# Patient Record
Sex: Female | Born: 1955 | Race: Black or African American | Hispanic: No | Smoking: Never smoker
Health system: Southern US, Community
[De-identification: ages and names within clinical notes are randomized; demographics above are authoritative.]

## PROBLEM LIST (undated history)

## (undated) DIAGNOSIS — I10 Essential (primary) hypertension: Secondary | ICD-10-CM

## (undated) DIAGNOSIS — N939 Abnormal uterine and vaginal bleeding, unspecified: Secondary | ICD-10-CM

## (undated) DIAGNOSIS — E559 Vitamin D deficiency, unspecified: Secondary | ICD-10-CM

## (undated) DIAGNOSIS — M1990 Unspecified osteoarthritis, unspecified site: Secondary | ICD-10-CM

## (undated) DIAGNOSIS — Q613 Polycystic kidney, unspecified: Secondary | ICD-10-CM

## (undated) DIAGNOSIS — K219 Gastro-esophageal reflux disease without esophagitis: Secondary | ICD-10-CM

## (undated) DIAGNOSIS — R7303 Prediabetes: Secondary | ICD-10-CM

## (undated) DIAGNOSIS — E119 Type 2 diabetes mellitus without complications: Secondary | ICD-10-CM

## (undated) DIAGNOSIS — D649 Anemia, unspecified: Secondary | ICD-10-CM

## (undated) HISTORY — PX: TUBAL LIGATION: SHX77

## (undated) HISTORY — PX: BREAST SURGERY: SHX581

## (undated) HISTORY — PX: TONSILLECTOMY: SUR1361

## (undated) HISTORY — PX: COLONOSCOPY: SHX5424

## (undated) HISTORY — PX: HIP SURGERY: SHX245

---

## 1999-08-01 HISTORY — PX: BREAST EXCISIONAL BIOPSY: SUR124

## 2004-08-03 ENCOUNTER — Ambulatory Visit: Payer: Self-pay | Admitting: Unknown Physician Specialty

## 2005-08-09 ENCOUNTER — Ambulatory Visit: Payer: Self-pay | Admitting: Unknown Physician Specialty

## 2006-05-31 ENCOUNTER — Ambulatory Visit: Payer: Self-pay | Admitting: Urology

## 2006-06-03 ENCOUNTER — Emergency Department: Payer: Self-pay | Admitting: Emergency Medicine

## 2006-09-04 ENCOUNTER — Ambulatory Visit: Payer: Self-pay | Admitting: Unknown Physician Specialty

## 2007-09-10 ENCOUNTER — Ambulatory Visit: Payer: Self-pay | Admitting: Unknown Physician Specialty

## 2008-09-15 ENCOUNTER — Ambulatory Visit: Payer: Self-pay | Admitting: Unknown Physician Specialty

## 2008-10-12 ENCOUNTER — Ambulatory Visit: Payer: Self-pay | Admitting: Urology

## 2008-12-21 ENCOUNTER — Ambulatory Visit: Payer: Self-pay | Admitting: Urology

## 2008-12-29 ENCOUNTER — Ambulatory Visit: Payer: Self-pay | Admitting: Urology

## 2009-01-25 ENCOUNTER — Ambulatory Visit: Payer: Self-pay | Admitting: Urology

## 2009-11-04 ENCOUNTER — Ambulatory Visit: Payer: Self-pay | Admitting: Unknown Physician Specialty

## 2011-02-21 ENCOUNTER — Ambulatory Visit: Payer: Self-pay | Admitting: Unknown Physician Specialty

## 2011-12-27 ENCOUNTER — Ambulatory Visit: Payer: Self-pay | Admitting: Urology

## 2012-02-23 ENCOUNTER — Ambulatory Visit: Payer: Self-pay | Admitting: Unknown Physician Specialty

## 2013-03-18 ENCOUNTER — Emergency Department: Payer: Self-pay | Admitting: Emergency Medicine

## 2013-03-18 LAB — CBC
HCT: 35.9 % (ref 35.0–47.0)
HGB: 12.2 g/dL (ref 12.0–16.0)
MCH: 28.5 pg (ref 26.0–34.0)
MCHC: 34 g/dL (ref 32.0–36.0)
MCV: 84 fL (ref 80–100)
Platelet: 204 10*3/uL (ref 150–440)
RBC: 4.28 10*6/uL (ref 3.80–5.20)
RDW: 13.5 % (ref 11.5–14.5)
WBC: 10.7 10*3/uL (ref 3.6–11.0)

## 2013-03-18 LAB — URINALYSIS, COMPLETE
Bacteria: NONE SEEN
Bilirubin,UR: NEGATIVE
Blood: NEGATIVE
Glucose,UR: NEGATIVE mg/dL (ref 0–75)
Hyaline Cast: 3
Ketone: NEGATIVE
Leukocyte Esterase: NEGATIVE
Nitrite: NEGATIVE
Ph: 6 (ref 4.5–8.0)
Protein: NEGATIVE
RBC,UR: <1 /HPF (ref 0–5)
Specific Gravity: 1.009 (ref 1.003–1.030)
Squamous Epithelial: <1
WBC UR: 2 /HPF (ref 0–5)

## 2013-03-18 LAB — COMPREHENSIVE METABOLIC PANEL
Albumin: 4.1 g/dL (ref 3.4–5.0)
Alkaline Phosphatase: 114 U/L (ref 50–136)
Anion Gap: 6 — ABNORMAL LOW (ref 7–16)
BUN: 19 mg/dL — ABNORMAL HIGH (ref 7–18)
Bilirubin,Total: 0.7 mg/dL (ref 0.2–1.0)
Calcium, Total: 9.8 mg/dL (ref 8.5–10.1)
Chloride: 98 mmol/L (ref 98–107)
Co2: 33 mmol/L — ABNORMAL HIGH (ref 21–32)
Creatinine: 1.16 mg/dL (ref 0.60–1.30)
EGFR (African American): >60
EGFR (Non-African Amer.): 53 — ABNORMAL LOW
Glucose: 151 mg/dL — ABNORMAL HIGH (ref 65–99)
Osmolality: 279 (ref 275–301)
Potassium: 2.8 mmol/L — ABNORMAL LOW (ref 3.5–5.1)
SGOT(AST): 25 U/L (ref 15–37)
SGPT (ALT): 26 U/L (ref 12–78)
Sodium: 137 mmol/L (ref 136–145)
Total Protein: 7.6 g/dL (ref 6.4–8.2)

## 2013-03-18 LAB — TROPONIN I: Troponin-I: <0.02 ng/mL

## 2014-06-09 ENCOUNTER — Ambulatory Visit: Payer: Self-pay | Admitting: Family Medicine

## 2014-07-31 HISTORY — PX: JOINT REPLACEMENT: SHX530

## 2014-08-05 ENCOUNTER — Ambulatory Visit: Payer: Self-pay | Admitting: Orthopedic Surgery

## 2014-08-05 DIAGNOSIS — I10 Essential (primary) hypertension: Secondary | ICD-10-CM

## 2014-08-05 DIAGNOSIS — Z01810 Encounter for preprocedural cardiovascular examination: Secondary | ICD-10-CM

## 2014-08-05 LAB — BASIC METABOLIC PANEL
Anion Gap: 7 (ref 7–16)
BUN: 11 mg/dL (ref 7–18)
Calcium, Total: 9.2 mg/dL (ref 8.5–10.1)
Chloride: 105 mmol/L (ref 98–107)
Co2: 30 mmol/L (ref 21–32)
Creatinine: 0.9 mg/dL (ref 0.60–1.30)
EGFR (African American): >60
EGFR (Non-African Amer.): >60
Glucose: 92 mg/dL (ref 65–99)
Osmolality: 282 (ref 275–301)
Potassium: 3.2 mmol/L — ABNORMAL LOW (ref 3.5–5.1)
Sodium: 142 mmol/L (ref 136–145)

## 2014-08-05 LAB — CBC
HCT: 37.8 % (ref 35.0–47.0)
HGB: 12.3 g/dL (ref 12.0–16.0)
MCH: 28.3 pg (ref 26.0–34.0)
MCHC: 32.5 g/dL (ref 32.0–36.0)
MCV: 87 fL (ref 80–100)
Platelet: 194 10*3/uL (ref 150–440)
RBC: 4.33 10*6/uL (ref 3.80–5.20)
RDW: 13.3 % (ref 11.5–14.5)
WBC: 7.1 10*3/uL (ref 3.6–11.0)

## 2014-08-05 LAB — MRSA PCR SCREENING

## 2014-08-05 LAB — PROTIME-INR
INR: 1
Prothrombin Time: 13.3 secs (ref 11.5–14.7)

## 2014-08-05 LAB — URINALYSIS, COMPLETE
Bacteria: NONE SEEN
Bilirubin,UR: NEGATIVE
Blood: NEGATIVE
Glucose,UR: NEGATIVE mg/dL (ref 0–75)
Ketone: NEGATIVE
Leukocyte Esterase: NEGATIVE
Nitrite: NEGATIVE
Ph: 7 (ref 4.5–8.0)
Protein: NEGATIVE
RBC,UR: <1 /HPF (ref 0–5)
Specific Gravity: 1.01 (ref 1.003–1.030)
Squamous Epithelial: NONE SEEN
WBC UR: <1 /HPF (ref 0–5)

## 2014-08-05 LAB — SEDIMENTATION RATE: Erythrocyte Sed Rate: 15 mm/hr (ref 0–30)

## 2014-08-05 LAB — APTT: Activated PTT: 26.7 secs (ref 23.6–35.9)

## 2014-08-11 ENCOUNTER — Inpatient Hospital Stay: Payer: Self-pay | Admitting: Orthopedic Surgery

## 2014-08-12 LAB — BASIC METABOLIC PANEL
Anion Gap: 6 — ABNORMAL LOW (ref 7–16)
BUN: 9 mg/dL (ref 7–18)
Calcium, Total: 8 mg/dL — ABNORMAL LOW (ref 8.5–10.1)
Chloride: 105 mmol/L (ref 98–107)
Co2: 29 mmol/L (ref 21–32)
Creatinine: 0.93 mg/dL (ref 0.60–1.30)
EGFR (African American): >60
EGFR (Non-African Amer.): >60
Glucose: 101 mg/dL — ABNORMAL HIGH (ref 65–99)
Osmolality: 278 (ref 275–301)
Potassium: 3.3 mmol/L — ABNORMAL LOW (ref 3.5–5.1)
Sodium: 140 mmol/L (ref 136–145)

## 2014-08-12 LAB — PLATELET COUNT: Platelet: 169 10*3/uL (ref 150–440)

## 2014-08-12 LAB — HEMOGLOBIN: HGB: 9.8 g/dL — ABNORMAL LOW (ref 12.0–16.0)

## 2014-08-13 LAB — POTASSIUM: Potassium: 3.3 mmol/L — ABNORMAL LOW (ref 3.5–5.1)

## 2014-08-13 LAB — HEMOGLOBIN: HGB: 9.4 g/dL — ABNORMAL LOW (ref 12.0–16.0)

## 2014-08-14 LAB — POTASSIUM: Potassium: 3.5 mmol/L (ref 3.5–5.1)

## 2014-11-23 LAB — SURGICAL PATHOLOGY

## 2014-11-29 NOTE — Op Note (Signed)
PATIENT NAME:  Lori Nguyen, DOMBROSKY MR#:  [id number] DATE OF BIRTH:  [birth year]  DATE OF PROCEDURE:  08/11/2014  PREOPERATIVE DIAGNOSIS: Severe right hip primary osteoarthritis.   POSTOPERATIVE DIAGNOSIS: Severe right hip primary osteoarthritis.   PROCEDURE:  Right total hip replacement anterior approach.   ANESTHESIA: Spinal.   SURGEON: Laurene Footman, MD   DESCRIPTION OF PROCEDURE: The patient was brought to the operating room, and after adequate anesthesia was obtained, the patient was placed on the operative table with the Medacta attachment, left leg on a well-padded table, the right foot in the Chidester boots. C-arm was brought in and good visualization of the hip could be obtained with a slight traction view as initial template. After the hip was prepped and draped in the usual sterile fashion, appropriate patient identification and timeout procedure were completed.   An anterior approach was made, centered over the greater trochanter. Incision carried down through the skin and subcutaneous tissue. The tensor fascia muscle  was retracted laterally after incising its outer fascia. The deep fascia incised and the lateral femoral vessels were quite small in this patient and cauterized. The anterior capsule was then exposed, then a capsulotomy performed exposing the neck. Retractors were placed around the neck and the neck cut was then made. The head was removed and it was quite deformed and small. The labrum was excised. The acetabulum was noted to have sclerotic bone. After reaming initially with a 46, a 48 mm reaming gave good bleeding bone and a good position to the cup. Trial was placed and the 48 cup fit well, 48 cup was inserted and impacted in the appropriate anteversion and lateral opening with C-arm confirming this.   Next, the leg was externally rotated and a pubofemoral and a limited ischiofemoral release were carried out.  The leg was dropped into extension and the proximal femur exposed.  It was opened with a box osteotome and sequentially broached up to a size 2. The size 2 gave very good fill and with a small head trial gave appropriate leg length. These components were chosen and the 2 stem was impacted followed by the S 28 mm head and liner for the 48 mm Versafitcup dual mobility liner. These were assembled on the back table, impacted on the trunnion and the hip reduced after irrigating the acetabulum free of any debris. The hip was very stable to range of motion and leg lengths appeared equal based on comparison to preop template x-ray with restoration of normal anatomy.   The wound was thoroughly irrigated and closed with a heavy Quill suture for the fascia, a subcutaneous drain was placed, 2-0 Quill subcutaneously and skin staples. Xeroform, 4 x 4's, ABD, tape applied and the patient was sent to the recovery room in stable condition.   ESTIMATED BLOOD LOSS: 400 mL.   COMPLICATIONS: None.   SPECIMEN: Removed femoral head.   IMPLANTS: Medacta AMIS size 2 standard with a 48 mm Versafitcup DM and liner S 28 mm head.   ____________________________ Laurene Footman, MD mjm:AT D: 08/11/2014 20:41:46 ET T: 08/12/2014 03:39:40 ET JOB#: [id number]  cc: Laurene Footman, MD, <Dictator> Laurene Footman MD ELECTRONICALLY SIGNED 08/12/2014 8:41

## 2014-11-29 NOTE — Discharge Summary (Signed)
PATIENT NAME:  Lori Nguyen, Lori Nguyen MR#:  [id number] DATE OF BIRTH:  January 18, 1956  DATE OF ADMISSION:  08/11/2014 DATE OF DISCHARGE:  08/14/2014    ADMITTING DIAGNOSIS: Right hip severe osteoarthritis.   DISCHARGE DIAGNOSIS: Right hip severe osteoarthritis.   PROCEDURE: Right total hip replacement, anterior approach,   ANESTHESIA: Spinal.   SURGEON: Hessie Knows, MD   ESTIMATED BLOOD LOSS: 400 mL.   COMPLICATIONS: None.   SPECIMEN: Removed femoral head.   IMPLANTS: Medacta Amis size 2 standard with a 48 mm Versafit cup DM with liner and 28 mm head.   HISTORY OF PRESENT ILLNESS: The patient has had severe right hip pain for greater than 1 year. Pain is interfering with activities of daily living. The patient has difficulty with sitting in a chair for longer than 1 hour as well as transferring from sitting to standing. She uses a cane full time. She has failed all nonoperative treatment options.   PHYSICAL EXAMINATION:  GENERAL: Well-developed, well-nourished, in no apparent distress.  EYES: Pupils equal, round, and reactive to light.  CARDIOVASCULAR: Regular rate and rhythm.  RESPIRATORY: Clear breath sounds. No wheezing, rales, rhonchi.  VASCULAR: No edema.  MUSCULOSKELETAL: Normal except as noted in history of present illness.  RIGHT HIP: Examination shows the patient has hip flexion to 90 degrees, hip extension to -10 degrees, abduction to 10 degrees and adduction to 5 degrees, internal rotation, external rotation is 0. She is neurovascularly intact in the right lower extremity. She has severe pain with hip internal rotation. She is neurovascularly intact in the right lower extremity.   HOSPITAL COURSE: The patient was admitted to the hospital on 08/11/2014. She had surgery that same day and was brought to the orthopedic floor from the PACU in stable condition. On postoperative day 1, the patient had acute postoperative blood loss anemia with a hemoglobin of 9.8.  She was hypokalemic at  3.3. She was supplemented with potassium. She had slow progress with physical therapy on the first day.  On postoperative day 2, the patient's potassium was stable at 3.3, hemoglobin was stable at 9.4. Vital signs are stable. She is doing well and made significant progress while ambulating 120 feet with physical therapy.  By postoperative day 3, hemoglobin was stable and potassium was up to 3.5.  Vital signs were stable and she was stable and ready for discharge to rehab facility for continuation of physical therapy.   CONDITION AT DISCHARGE: Stable.   DISCHARGE INSTRUCTIONS: The patient may gradually increase weight-bearing on the affected extremity.  Elevate the affected foot or leg on 1 to 2 pillows with the foot higher than the knee. Thigh-high TED hose on both legs, remove 1 hour per 8 hour shift. Elevate the heels off the bed. Incentive spirometer every hour while awake. Encourage cough and deep breathing. The patient may resume a regular diet as tolerated. Apply an ice pack to the affected area. Do not get the dressing or bandage wet or dirty. Call [facility] orthopedics if the dressing gets water under it. Leave the dressing on. Call [facility] orthopedics if any of the following occur: Bright red bleeding from the incision wound, fever above 101.5 degrees, redness, swelling, or drainage at the incision site. Call [facility] orthopedics if you experience any increased leg pain, numbness or weakness in your legs or bowel or bladder symptoms.  Call [facility] orthopedics for follow-up appointment in 2 weeks.     ____________________________ T. Rachelle Hora, PA-C tcg:DT D: 08/14/2014 07:53:39  ET T: 08/14/2014 08:19:16 ET JOB#: [id number]  cc: T. Rachelle Hora, PA-C, <Dictator> Duanne Guess [place] ELECTRONICALLY SIGNED 08/19/2014 8:26

## 2015-04-27 ENCOUNTER — Other Ambulatory Visit: Payer: Self-pay | Admitting: *Deleted

## 2015-04-27 DIAGNOSIS — Z1231 Encounter for screening mammogram for malignant neoplasm of breast: Secondary | ICD-10-CM

## 2015-05-01 ENCOUNTER — Encounter: Payer: Self-pay | Admitting: Emergency Medicine

## 2015-05-01 ENCOUNTER — Emergency Department
Admission: EM | Admit: 2015-05-01 | Discharge: 2015-05-01 | Disposition: A | Discharge status: Home / Self Care | Payer: 59 | Attending: Emergency Medicine | Admitting: Emergency Medicine

## 2015-05-01 DIAGNOSIS — E119 Type 2 diabetes mellitus without complications: Secondary | ICD-10-CM | POA: Diagnosis not present

## 2015-05-01 DIAGNOSIS — M549 Dorsalgia, unspecified: Secondary | ICD-10-CM | POA: Insufficient documentation

## 2015-05-01 DIAGNOSIS — E876 Hypokalemia: Secondary | ICD-10-CM

## 2015-05-01 DIAGNOSIS — R112 Nausea with vomiting, unspecified: Secondary | ICD-10-CM | POA: Diagnosis present

## 2015-05-01 DIAGNOSIS — Z91040 Latex allergy status: Secondary | ICD-10-CM | POA: Diagnosis not present

## 2015-05-01 DIAGNOSIS — I10 Essential (primary) hypertension: Secondary | ICD-10-CM | POA: Insufficient documentation

## 2015-05-01 DIAGNOSIS — Z8639 Personal history of other endocrine, nutritional and metabolic disease: Secondary | ICD-10-CM

## 2015-05-01 HISTORY — DX: Essential (primary) hypertension: I10

## 2015-05-01 HISTORY — DX: Polycystic kidney, unspecified: Q61.3

## 2015-05-01 HISTORY — DX: Type 2 diabetes mellitus without complications: E11.9

## 2015-05-01 LAB — URINALYSIS COMPLETE WITH MICROSCOPIC (ARMC ONLY)
Bacteria, UA: NONE SEEN
Bilirubin Urine: NEGATIVE
Glucose, UA: NEGATIVE mg/dL
Ketones, ur: NEGATIVE mg/dL
Leukocytes, UA: NEGATIVE
Nitrite: NEGATIVE
Protein, ur: 30 mg/dL — AB
Specific Gravity, Urine: 1.01 (ref 1.005–1.030)
Squamous Epithelial / LPF: NONE SEEN
pH: 6 (ref 5.0–8.0)

## 2015-05-01 LAB — CBC WITH DIFFERENTIAL/PLATELET
Basophils Absolute: 0.1 10*3/uL (ref 0–0.1)
Basophils Relative: 1 %
Eosinophils Absolute: 0 10*3/uL (ref 0–0.7)
Eosinophils Relative: 0 %
HCT: 33.8 % — ABNORMAL LOW (ref 35.0–47.0)
Hemoglobin: 11.4 g/dL — ABNORMAL LOW (ref 12.0–16.0)
Lymphocytes Relative: 9 %
Lymphs Abs: 1.2 10*3/uL (ref 1.0–3.6)
MCH: 28.3 pg (ref 26.0–34.0)
MCHC: 33.6 g/dL (ref 32.0–36.0)
MCV: 84.3 fL (ref 80.0–100.0)
Monocytes Absolute: 1.1 10*3/uL — ABNORMAL HIGH (ref 0.2–0.9)
Monocytes Relative: 9 %
Neutro Abs: 10.9 10*3/uL — ABNORMAL HIGH (ref 1.4–6.5)
Neutrophils Relative %: 81 %
Platelets: 196 10*3/uL (ref 150–440)
RBC: 4.01 MIL/uL (ref 3.80–5.20)
RDW: 13.2 % (ref 11.5–14.5)
WBC: 13.4 10*3/uL — ABNORMAL HIGH (ref 3.6–11.0)

## 2015-05-01 LAB — COMPREHENSIVE METABOLIC PANEL
ALT: 15 U/L (ref 14–54)
AST: 27 U/L (ref 15–41)
Albumin: 3.9 g/dL (ref 3.5–5.0)
Alkaline Phosphatase: 82 U/L (ref 38–126)
Anion gap: 10 (ref 5–15)
BUN: 10 mg/dL (ref 6–20)
CO2: 28 mmol/L (ref 22–32)
Calcium: 8.8 mg/dL — ABNORMAL LOW (ref 8.9–10.3)
Chloride: 99 mmol/L — ABNORMAL LOW (ref 101–111)
Creatinine, Ser: 0.87 mg/dL (ref 0.44–1.00)
GFR calc Af Amer: >60 mL/min (ref >60)
GFR calc non Af Amer: >60 mL/min (ref >60)
Glucose, Bld: 195 mg/dL — ABNORMAL HIGH (ref 65–99)
Potassium: 2.9 mmol/L — CL (ref 3.5–5.1)
Sodium: 137 mmol/L (ref 135–145)
Total Bilirubin: 0.9 mg/dL (ref 0.3–1.2)
Total Protein: 6.9 g/dL (ref 6.5–8.1)

## 2015-05-01 LAB — LIPASE, BLOOD: Lipase: 17 U/L — ABNORMAL LOW (ref 22–51)

## 2015-05-01 MED ORDER — KETOROLAC TROMETHAMINE 30 MG/ML IJ SOLN
30.0000 mg | Freq: Once | INTRAMUSCULAR | Status: AC
  Administered 2015-05-01: 30 mg via INTRAVENOUS
  Filled 2015-05-01: qty 1

## 2015-05-01 MED ORDER — HYDROCODONE-ACETAMINOPHEN 5-325 MG PO TABS: 1.0000 | ORAL_TABLET | ORAL | Status: DC | PRN

## 2015-05-01 MED ORDER — POTASSIUM CHLORIDE ER 10 MEQ PO TBCR: 10.0000 meq | EXTENDED_RELEASE_TABLET | Freq: Every day | ORAL | Status: DC

## 2015-05-01 MED ORDER — ONDANSETRON HCL 4 MG/2ML IJ SOLN
INTRAMUSCULAR | Status: AC
  Filled 2015-05-01: qty 2

## 2015-05-01 MED ORDER — POTASSIUM CHLORIDE CRYS ER 20 MEQ PO TBCR
40.0000 meq | EXTENDED_RELEASE_TABLET | Freq: Once | ORAL | Status: AC
  Administered 2015-05-01: 40 meq via ORAL
  Filled 2015-05-01: qty 2

## 2015-05-01 MED ORDER — ONDANSETRON 4 MG PO TBDP: 4.0000 mg | ORAL_TABLET | Freq: Three times a day (TID) | ORAL | Status: DC | PRN

## 2015-05-01 MED ORDER — ONDANSETRON HCL 4 MG/2ML IJ SOLN
4.0000 mg | Freq: Once | INTRAMUSCULAR | Status: AC
  Administered 2015-05-01: 4 mg via INTRAVENOUS

## 2015-05-01 NOTE — ED Notes (Signed)
Nausea and vomiting since yesterday.

## 2015-05-01 NOTE — ED Provider Notes (Signed)
[facility] Emergency Department Provider Note  ____________________________________________  Time seen: Approximately 1:06 PM  I have reviewed the triage vital signs and the nursing notes.   HISTORY  Chief Complaint Emesis   HPI Lori Nguyen is a 59 y.o. female is here complaining of vomiting since yesterday. Patient states that she lost count of how many times she vomited yesterday. She is only vomited twice today. She denies any history of diarrhea. She denies any history of fever, chills or dizziness. Zofran was given to the patient in triage and she states that her nausea is improved. Patient has had history of hypokalemia and currently is taking an over-the-counter supplement. She is unaware of how low her potassium is actually been in the past. She denies any symptoms of chest pain or shortness of breath. Patient states she has been ambulatory at home without any difficulty. Currently she rates her pain as an 8 out of 10. She also complains of right back pain. There is no history of injury.   Past Medical History  Diagnosis Date  . Hypertension   . Diabetes mellitus without complication ([place])   . Polycystic kidney     There are no active problems to display for this patient.   Past Surgical History  Procedure Laterality Date  . Hip surgery      history of R hip repair    Current Outpatient Rx  Name  Route  Sig  Dispense  Refill  . HYDROcodone-acetaminophen (NORCO/VICODIN) 5-325 MG tablet   Oral   Take 1 tablet by mouth every 4 (four) hours as needed for moderate pain.   20 tablet   0   . ondansetron (ZOFRAN ODT) 4 MG disintegrating tablet   Oral   Take 1 tablet (4 mg total) by mouth every 8 (eight) hours as needed for nausea or vomiting.   20 tablet   0   . potassium chloride (K-DUR) 10 MEQ tablet   Oral   Take 1 tablet (10 mEq total) by mouth daily.   7 tablet   0     Allergies Latex  No family history on file.  Social  History Social History  Substance Use Topics  . Smoking status: Never Smoker   . Smokeless tobacco: None  . Alcohol Use: Yes    Review of Systems Constitutional: No fever/chills Eyes: No visual changes. ENT: No sore throat. Cardiovascular: Denies chest pain. Respiratory: Denies shortness of breath. Gastrointestinal: No abdominal pain.  Positive nausea, positive vomiting.  Genitourinary: Negative for dysuria. Musculoskeletal: Positive for back pain. Skin: Negative for rash. Neurological: Negative for headaches, focal weakness or numbness.  10-point ROS otherwise negative.  ____________________________________________   PHYSICAL EXAM:  VITAL SIGNS: ED Triage Vitals  Enc Vitals Group     BP 05/01/15 1153 138/52 mmHg     Pulse Rate 05/01/15 1153 78     Resp 05/01/15 1153 18     Temp 05/01/15 1153 98.8 F (37.1 C)     Temp Source 05/01/15 1153 Oral     SpO2 05/01/15 1153 98 %     Weight 05/01/15 1153 160 lb (72.576 kg)     Height 05/01/15 1153 4\' 11"  (1.499 m)     Head Cir --      Peak Flow --      Pain Score 05/01/15 1154 8     Pain Loc --      Pain Edu? --      Excl. in [place]? --  Constitutional: Alert and oriented. Well appearing and in no acute distress. Eyes: Conjunctivae are normal. PERRL. EOMI. Head: Atraumatic. Nose: No congestion/rhinnorhea. Mouth/Throat: Mucous membranes are moist.  Oropharynx non-erythematous. Neck: No stridor.  Supple Cardiovascular: Normal rate, regular rhythm. Grossly normal heart sounds.  Good peripheral circulation. Respiratory: Normal respiratory effort.  No retractions. Lungs CTAB. Gastrointestinal: Soft and nontender. No distention. No abdominal bruits. No CVA tenderness. Now sounds are hyperactive 4 quadrants. Musculoskeletal: On palpation of her back patient is tender lower posterior ribs. No gross deformity is noted and there is no tenderness on palpation of the thoracic or lumbar spine. Patient has guarded movement when  sitting up. No lower extremity tenderness nor edema.  No joint effusions. Neurologic:  Normal speech and language. No gross focal neurologic deficits are appreciated. No gait instability. Skin:  Skin is warm, dry and intact. No rash noted. Psychiatric: Mood and affect are normal. Speech and behavior are normal.  ____________________________________________   LABS (all labs ordered are listed, but only abnormal results are displayed)  Labs Reviewed  CBC WITH DIFFERENTIAL/PLATELET - Abnormal; Notable for the following:    WBC 13.4 (*)    Hemoglobin 11.4 (*)    HCT 33.8 (*)    Neutro Abs 10.9 (*)    Monocytes Absolute 1.1 (*)    All other components within normal limits  COMPREHENSIVE METABOLIC PANEL - Abnormal; Notable for the following:    Potassium 2.9 (*)    Chloride 99 (*)    Glucose, Bld 195 (*)    Calcium 8.8 (*)    All other components within normal limits  LIPASE, BLOOD - Abnormal; Notable for the following:    Lipase 17 (*)    All other components within normal limits  URINALYSIS COMPLETEWITH MICROSCOPIC ([facility] ONLY) - Abnormal; Notable for the following:    Color, Urine YELLOW (*)    APPearance CLEAR (*)    Hgb urine dipstick 1+ (*)    Protein, ur 30 (*)    All other components within normal limits   ____________________________________________  EKG  Per Dr.McShane ____________________________________________  PROCEDURES  Procedure(s) performed: None  Critical Care performed: No  ____________________________________________   INITIAL IMPRESSION / ASSESSMENT AND PLAN / ED COURSE  Pertinent labs & imaging results that were available during my care of the patient were reviewed by me and considered in my medical decision making (see chart for details).  ----------------------------------------- 3:25 PM on 05/01/2015 ----------------------------------------- No continued vomiting while in the emergency room. Patient was given K Dur 40 mg by mouth and  tolerated it well. Back pain is thought to be muscle skeletal from vomiting yesterday. IV Toradol was given to help control some of the pain.  Patient will be discharged on a door 10 mg by mouth daily for 7 days and she is instructed to follow-up with her PCP for recheck of her potassium level. She is also given a prescription for Zofran for nausea vomiting and Norco as needed for back pain. She is to return to the emergency room if any severe worsening of her symptoms, retractable vomiting, inability to take her K-Dur. Patient was given Norco to control some of her muscle skeletal pain most likely from vomiting.  ----------------------------------------- 4:02 PM on 05/01/2015 -----------------------------------------  No continued vomiting or back pain. Medications were explained to the patient. ____________________________________________   FINAL CLINICAL IMPRESSION(S) / ED DIAGNOSES  Final diagnoses:  Hypokalemia  Nausea and vomiting, vomiting of unspecified type  Hx of hypokalemia  Johnn Hai, PA-C 05/01/15 1601  Johnn Hai, PA-C 05/01/15 [place], MD 05/02/15 [phone]

## 2015-05-01 NOTE — ED Notes (Signed)
Pt states zofran has helped ease her nausea.

## 2015-06-03 ENCOUNTER — Encounter
Admission: RE | Admit: 2015-06-03 | Discharge: 2015-06-03 | Disposition: A | Discharge status: Home / Self Care | Payer: 59 | Source: Ambulatory Visit | Attending: Orthopedic Surgery | Admitting: Orthopedic Surgery

## 2015-06-03 DIAGNOSIS — Z01812 Encounter for preprocedural laboratory examination: Secondary | ICD-10-CM | POA: Insufficient documentation

## 2015-06-03 HISTORY — DX: Gastro-esophageal reflux disease without esophagitis: K21.9

## 2015-06-03 HISTORY — DX: Unspecified osteoarthritis, unspecified site: M19.90

## 2015-06-03 HISTORY — DX: Anemia, unspecified: D64.9

## 2015-06-03 LAB — URINALYSIS COMPLETE WITH MICROSCOPIC (ARMC ONLY)
Bilirubin Urine: NEGATIVE
Glucose, UA: NEGATIVE mg/dL
Hgb urine dipstick: NEGATIVE
Ketones, ur: NEGATIVE mg/dL
Nitrite: NEGATIVE
Protein, ur: NEGATIVE mg/dL
Specific Gravity, Urine: 1.01 (ref 1.005–1.030)
pH: 8 (ref 5.0–8.0)

## 2015-06-03 LAB — BASIC METABOLIC PANEL
Anion gap: 9 (ref 5–15)
BUN: 15 mg/dL (ref 6–20)
CO2: 30 mmol/L (ref 22–32)
Calcium: 10 mg/dL (ref 8.9–10.3)
Chloride: 103 mmol/L (ref 101–111)
Creatinine, Ser: 0.81 mg/dL (ref 0.44–1.00)
GFR calc Af Amer: >60 mL/min (ref >60)
GFR calc non Af Amer: >60 mL/min (ref >60)
Glucose, Bld: 93 mg/dL (ref 65–99)
Potassium: 3.7 mmol/L (ref 3.5–5.1)
Sodium: 142 mmol/L (ref 135–145)

## 2015-06-03 LAB — CBC
HCT: 36.7 % (ref 35.0–47.0)
Hemoglobin: 12.2 g/dL (ref 12.0–16.0)
MCH: 28 pg (ref 26.0–34.0)
MCHC: 33.3 g/dL (ref 32.0–36.0)
MCV: 84 fL (ref 80.0–100.0)
Platelets: 189 10*3/uL (ref 150–440)
RBC: 4.37 MIL/uL (ref 3.80–5.20)
RDW: 14 % (ref 11.5–14.5)
WBC: 6.5 10*3/uL (ref 3.6–11.0)

## 2015-06-03 LAB — PROTIME-INR
INR: 1.04
Prothrombin Time: 13.8 seconds (ref 11.4–15.0)

## 2015-06-03 LAB — SEDIMENTATION RATE: Sed Rate: 23 mm/hr (ref 0–30)

## 2015-06-03 LAB — APTT: aPTT: 27 seconds (ref 24–36)

## 2015-06-03 LAB — TYPE AND SCREEN
ABO/RH(D): A POS
Antibody Screen: NEGATIVE

## 2015-06-03 LAB — SURGICAL PCR SCREEN
MRSA, PCR: NEGATIVE
Staphylococcus aureus: NEGATIVE

## 2015-06-03 LAB — ABO/RH: ABO/RH(D): A POS

## 2015-06-03 NOTE — Patient Instructions (Signed)
  Your procedure is scheduled on: June 15, 2015(Tuesday) Report to Day Surgery.Operating Room Services) To find out your arrival time please call [phone] between 1PM - 3PM on June 14, 2015 (Monday).  Remember: Instructions that are not followed completely may result in serious medical risk, up to and including death, or upon the discretion of your surgeon and anesthesiologist your surgery may need to be rescheduled.    __x__ 1. Do not eat food or drink liquids after midnight. No gum chewing or hard candies.     __x__ 2. No Alcohol for 24 hours before or after surgery.   ____ 3. Bring all medications with you on the day of surgery if instructed.    __x__ 4. Notify your doctor if there is any change in your medical condition     (cold, fever, infections).     Do not wear jewelry, make-up, hairpins, clips or nail polish.  Do not wear lotions, powders, or perfumes. You may wear deodorant.  Do not shave 48 hours prior to surgery. Men may shave face and neck.  Do not bring valuables to the hospital.    [facility] is not responsible for any belongings or valuables.               Contacts, dentures or bridgework may not be worn into surgery.  Leave your suitcase in the car. After surgery it may be brought to your room.  For patients admitted to the hospital, discharge time is determined by your                treatment team.   Patients discharged the day of surgery will not be allowed to drive home.   Please read over the following fact sheets that you were given:   MRSA Information and Surgical Site Infection Prevention   _x___ Take these medicines the morning of surgery with A SIP OF WATER:    1. Pantoprazole  2.   3.   4.  5.  6.  ____ Fleet Enema (as directed)   _x___ Use CHG Soap as directed  ____ Use inhalers on the day of surgery  ____ Stop metformin 2 days prior to surgery    ____ Take 1/2 of usual insulin dose the night before surgery and none on the morning of  surgery.   __x__ Stop Coumadin/Plavix/aspirin on (STOP ASPIRIN ONE WEEK BEFORE SURGERY)  __x_ Stop Anti-inflammatories on (STOP ETODOLAC ONE WEEK BEFORE SURGERY)   __x__ Stop supplements until after surgery.  (STOP GREEN TEA AND BIOTIN NOW)  ____ Bring C-Pap to the hospital.

## 2015-06-03 NOTE — OR Nursing (Signed)
AS INSTRUCTED BY DR Corinna Lines FOR CLEARANCE CALLED AND FAXED TO [facility]. ALSO SENT TO DR Chauncy Passy WITH [facility]

## 2015-06-09 NOTE — OR Nursing (Signed)
Spoke with Darlene at [facility] re status of clearance and refaxed request

## 2015-06-14 ENCOUNTER — Ambulatory Visit
Admission: RE | Admit: 2015-06-14 | Discharge: 2015-06-14 | Disposition: A | Discharge status: Home / Self Care | Payer: 59 | Source: Ambulatory Visit | Attending: Family Medicine | Admitting: Family Medicine

## 2015-06-14 DIAGNOSIS — Z1231 Encounter for screening mammogram for malignant neoplasm of breast: Secondary | ICD-10-CM | POA: Diagnosis present

## 2015-06-15 ENCOUNTER — Inpatient Hospital Stay: Payer: 59 | Admitting: Anesthesiology

## 2015-06-15 ENCOUNTER — Inpatient Hospital Stay: Payer: 59

## 2015-06-15 ENCOUNTER — Encounter
Admission: RE | Disposition: A | Discharge status: Skilled Nursing Facility | Payer: Self-pay | Source: Ambulatory Visit | Attending: Orthopedic Surgery

## 2015-06-15 ENCOUNTER — Inpatient Hospital Stay
Admission: RE | Admit: 2015-06-15 | Discharge: 2015-06-17 | DRG: 470 | Disposition: A | Discharge status: Skilled Nursing Facility | Payer: 59 | Source: Ambulatory Visit | Attending: Orthopedic Surgery | Admitting: Orthopedic Surgery

## 2015-06-15 DIAGNOSIS — Q613 Polycystic kidney, unspecified: Secondary | ICD-10-CM | POA: Diagnosis not present

## 2015-06-15 DIAGNOSIS — Z79899 Other long term (current) drug therapy: Secondary | ICD-10-CM

## 2015-06-15 DIAGNOSIS — G8918 Other acute postprocedural pain: Secondary | ICD-10-CM

## 2015-06-15 DIAGNOSIS — E119 Type 2 diabetes mellitus without complications: Secondary | ICD-10-CM | POA: Diagnosis present

## 2015-06-15 DIAGNOSIS — M1612 Unilateral primary osteoarthritis, left hip: Secondary | ICD-10-CM | POA: Diagnosis present

## 2015-06-15 DIAGNOSIS — K219 Gastro-esophageal reflux disease without esophagitis: Secondary | ICD-10-CM | POA: Diagnosis present

## 2015-06-15 DIAGNOSIS — I10 Essential (primary) hypertension: Secondary | ICD-10-CM | POA: Diagnosis present

## 2015-06-15 DIAGNOSIS — Z91040 Latex allergy status: Secondary | ICD-10-CM | POA: Diagnosis not present

## 2015-06-15 DIAGNOSIS — Z7982 Long term (current) use of aspirin: Secondary | ICD-10-CM

## 2015-06-15 DIAGNOSIS — Z419 Encounter for procedure for purposes other than remedying health state, unspecified: Secondary | ICD-10-CM

## 2015-06-15 HISTORY — PX: TOTAL HIP ARTHROPLASTY: SHX124

## 2015-06-15 LAB — CBC
HCT: 32.5 % — ABNORMAL LOW (ref 35.0–47.0)
Hemoglobin: 10.7 g/dL — ABNORMAL LOW (ref 12.0–16.0)
MCH: 27.9 pg (ref 26.0–34.0)
MCHC: 33 g/dL (ref 32.0–36.0)
MCV: 84.6 fL (ref 80.0–100.0)
Platelets: 164 10*3/uL (ref 150–440)
RBC: 3.84 MIL/uL (ref 3.80–5.20)
RDW: 13.3 % (ref 11.5–14.5)
WBC: 11.2 10*3/uL — ABNORMAL HIGH (ref 3.6–11.0)

## 2015-06-15 LAB — CREATININE, SERUM
Creatinine, Ser: 0.75 mg/dL (ref 0.44–1.00)
GFR calc Af Amer: >60 mL/min (ref >60)
GFR calc non Af Amer: >60 mL/min (ref >60)

## 2015-06-15 LAB — GLUCOSE, CAPILLARY
Glucose-Capillary: 110 mg/dL — ABNORMAL HIGH (ref 65–99)
Glucose-Capillary: 123 mg/dL — ABNORMAL HIGH (ref 65–99)

## 2015-06-15 SURGERY — ARTHROPLASTY, HIP, TOTAL, ANTERIOR APPROACH
Anesthesia: Spinal | Site: Hip | Laterality: Left | Wound class: Clean

## 2015-06-15 MED ORDER — ENOXAPARIN SODIUM 40 MG/0.4ML ~~LOC~~ SOLN
40.0000 mg | SUBCUTANEOUS | Status: DC
  Administered 2015-06-16 – 2015-06-17 (×2): 40 mg via SUBCUTANEOUS
  Filled 2015-06-15 (×2): qty 0.4

## 2015-06-15 MED ORDER — CEFAZOLIN SODIUM-DEXTROSE 2-3 GM-% IV SOLR
2.0000 g | Freq: Four times a day (QID) | INTRAVENOUS | Status: AC
  Administered 2015-06-15 – 2015-06-16 (×3): 2 g via INTRAVENOUS
  Filled 2015-06-15 (×3): qty 50

## 2015-06-15 MED ORDER — ONDANSETRON HCL 4 MG PO TABS: 4.0000 mg | ORAL_TABLET | Freq: Four times a day (QID) | ORAL | Status: DC | PRN

## 2015-06-15 MED ORDER — ACETAMINOPHEN 650 MG RE SUPP: 650.0000 mg | Freq: Four times a day (QID) | RECTAL | Status: DC | PRN

## 2015-06-15 MED ORDER — PROPOFOL 10 MG/ML IV BOLUS
INTRAVENOUS | Status: DC | PRN
  Administered 2015-06-15: 30 mg via INTRAVENOUS

## 2015-06-15 MED ORDER — BUPIVACAINE-EPINEPHRINE (PF) 0.25% -1:200000 IJ SOLN
INTRAMUSCULAR | Status: AC
  Filled 2015-06-15: qty 30

## 2015-06-15 MED ORDER — DOCUSATE SODIUM 100 MG PO CAPS
100.0000 mg | ORAL_CAPSULE | Freq: Two times a day (BID) | ORAL | Status: DC
  Administered 2015-06-15 – 2015-06-17 (×4): 100 mg via ORAL
  Filled 2015-06-15 (×4): qty 1

## 2015-06-15 MED ORDER — EPHEDRINE SULFATE 50 MG/ML IJ SOLN
INTRAMUSCULAR | Status: DC | PRN
  Administered 2015-06-15: 10 mg via INTRAVENOUS

## 2015-06-15 MED ORDER — ALUM & MAG HYDROXIDE-SIMETH 200-200-20 MG/5ML PO SUSP: 30.0000 mL | ORAL | Status: DC | PRN

## 2015-06-15 MED ORDER — NEOMYCIN-POLYMYXIN B GU 40-200000 IR SOLN
Status: AC
  Filled 2015-06-15: qty 4

## 2015-06-15 MED ORDER — LOSARTAN POTASSIUM 50 MG PO TABS
100.0000 mg | ORAL_TABLET | Freq: Every day | ORAL | Status: DC
  Administered 2015-06-15 – 2015-06-17 (×3): 100 mg via ORAL
  Filled 2015-06-15 (×3): qty 2

## 2015-06-15 MED ORDER — CEFAZOLIN SODIUM-DEXTROSE 2-3 GM-% IV SOLR
INTRAVENOUS | Status: AC
  Administered 2015-06-15: 2 g via INTRAVENOUS
  Filled 2015-06-15: qty 50

## 2015-06-15 MED ORDER — MORPHINE SULFATE (PF) 2 MG/ML IV SOLN
2.0000 mg | INTRAVENOUS | Status: DC | PRN
  Administered 2015-06-15 – 2015-06-16 (×3): 2 mg via INTRAVENOUS
  Filled 2015-06-15 (×3): qty 1

## 2015-06-15 MED ORDER — MAGNESIUM CITRATE PO SOLN: 1.0000 | Freq: Once | ORAL | Status: DC | PRN

## 2015-06-15 MED ORDER — ONDANSETRON HCL 4 MG/2ML IJ SOLN: 4.0000 mg | Freq: Once | INTRAMUSCULAR | Status: DC | PRN

## 2015-06-15 MED ORDER — CYCLOBENZAPRINE HCL 10 MG PO TABS: 10.0000 mg | ORAL_TABLET | Freq: Three times a day (TID) | ORAL | Status: DC | PRN

## 2015-06-15 MED ORDER — CEFAZOLIN SODIUM-DEXTROSE 2-3 GM-% IV SOLR: 2.0000 g | Freq: Once | INTRAVENOUS | Status: DC

## 2015-06-15 MED ORDER — KETAMINE HCL 50 MG/ML IJ SOLN
INTRAMUSCULAR | Status: DC | PRN
  Administered 2015-06-15: 50 mg via INTRAMUSCULAR

## 2015-06-15 MED ORDER — BISACODYL 10 MG RE SUPP
10.0000 mg | Freq: Every day | RECTAL | Status: DC | PRN
  Administered 2015-06-17: 10 mg via RECTAL
  Filled 2015-06-15: qty 1

## 2015-06-15 MED ORDER — NEOMYCIN-POLYMYXIN B GU 40-200000 IR SOLN
Status: DC | PRN
  Administered 2015-06-15: 4 mL

## 2015-06-15 MED ORDER — BUPIVACAINE-EPINEPHRINE 0.25% -1:200000 IJ SOLN
INTRAMUSCULAR | Status: DC | PRN
  Administered 2015-06-15: 30 mL

## 2015-06-15 MED ORDER — ACETAMINOPHEN 325 MG PO TABS: 650.0000 mg | ORAL_TABLET | Freq: Four times a day (QID) | ORAL | Status: DC | PRN

## 2015-06-15 MED ORDER — PHENYLEPHRINE HCL 10 MG/ML IJ SOLN
INTRAMUSCULAR | Status: DC | PRN
  Administered 2015-06-15 (×2): 100 ug via INTRAVENOUS
  Administered 2015-06-15 (×2): 200 ug via INTRAVENOUS
  Administered 2015-06-15 (×2): 100 ug via INTRAVENOUS

## 2015-06-15 MED ORDER — TRANEXAMIC ACID 1000 MG/10ML IV SOLN
1000.0000 mg | INTRAVENOUS | Status: AC
  Administered 2015-06-15: 1000 mg via INTRAVENOUS
  Filled 2015-06-15: qty 10

## 2015-06-15 MED ORDER — METOCLOPRAMIDE HCL 5 MG PO TABS: 5.0000 mg | ORAL_TABLET | Freq: Three times a day (TID) | ORAL | Status: DC | PRN

## 2015-06-15 MED ORDER — ONDANSETRON HCL 4 MG/2ML IJ SOLN
4.0000 mg | Freq: Four times a day (QID) | INTRAMUSCULAR | Status: DC | PRN
  Administered 2015-06-15 – 2015-06-16 (×2): 4 mg via INTRAVENOUS
  Filled 2015-06-15 (×3): qty 2

## 2015-06-15 MED ORDER — OXYCODONE HCL 5 MG PO TABS
5.0000 mg | ORAL_TABLET | ORAL | Status: DC | PRN
  Administered 2015-06-15 – 2015-06-16 (×4): 5 mg via ORAL
  Administered 2015-06-17: 10 mg via ORAL
  Filled 2015-06-15: qty 2
  Filled 2015-06-15 (×5): qty 1
  Filled 2015-06-15: qty 2

## 2015-06-15 MED ORDER — LOSARTAN POTASSIUM-HCTZ 100-25 MG PO TABS: 1.0000 | ORAL_TABLET | Freq: Every day | ORAL | Status: DC

## 2015-06-15 MED ORDER — ASPIRIN EC 81 MG PO TBEC
81.0000 mg | DELAYED_RELEASE_TABLET | Freq: Every day | ORAL | Status: DC
  Administered 2015-06-16 – 2015-06-17 (×2): 81 mg via ORAL
  Filled 2015-06-15 (×2): qty 1

## 2015-06-15 MED ORDER — VITAMIN D 1000 UNITS PO TABS
1000.0000 [IU] | ORAL_TABLET | Freq: Every day | ORAL | Status: DC
  Administered 2015-06-16 – 2015-06-17 (×2): 1000 [IU] via ORAL
  Filled 2015-06-15 (×2): qty 1

## 2015-06-15 MED ORDER — MAGNESIUM HYDROXIDE 400 MG/5ML PO SUSP
30.0000 mL | Freq: Every day | ORAL | Status: DC | PRN
  Administered 2015-06-16: 30 mL via ORAL
  Filled 2015-06-15 (×2): qty 30

## 2015-06-15 MED ORDER — PROPOFOL 500 MG/50ML IV EMUL
INTRAVENOUS | Status: DC | PRN
  Administered 2015-06-15: 80 ug/kg/min via INTRAVENOUS

## 2015-06-15 MED ORDER — SODIUM CHLORIDE 0.9 % IV SOLN
INTRAVENOUS | Status: DC
  Administered 2015-06-15 – 2015-06-16 (×3): via INTRAVENOUS

## 2015-06-15 MED ORDER — HYDROCHLOROTHIAZIDE 25 MG PO TABS
25.0000 mg | ORAL_TABLET | Freq: Every day | ORAL | Status: DC
  Administered 2015-06-15 – 2015-06-17 (×3): 25 mg via ORAL
  Filled 2015-06-15 (×3): qty 1

## 2015-06-15 MED ORDER — PHENOL 1.4 % MT LIQD: 1.0000 | OROMUCOSAL | Status: DC | PRN

## 2015-06-15 MED ORDER — MENTHOL 3 MG MT LOZG
1.0000 | LOZENGE | OROMUCOSAL | Status: DC | PRN
Start: 2015-06-15 — End: 2015-06-17

## 2015-06-15 MED ORDER — FENTANYL CITRATE (PF) 100 MCG/2ML IJ SOLN: 25.0000 ug | INTRAMUSCULAR | Status: DC | PRN

## 2015-06-15 MED ORDER — MIDAZOLAM HCL 5 MG/5ML IJ SOLN
INTRAMUSCULAR | Status: DC | PRN
  Administered 2015-06-15: 2 mg via INTRAVENOUS

## 2015-06-15 MED ORDER — PANTOPRAZOLE SODIUM 40 MG PO TBEC
40.0000 mg | DELAYED_RELEASE_TABLET | Freq: Every day | ORAL | Status: DC
  Administered 2015-06-16 – 2015-06-17 (×2): 40 mg via ORAL
  Filled 2015-06-15 (×2): qty 1

## 2015-06-15 MED ORDER — SODIUM CHLORIDE 0.9 % IV SOLN
INTRAVENOUS | Status: DC
  Administered 2015-06-15 (×2): via INTRAVENOUS

## 2015-06-15 MED ORDER — BUPIVACAINE HCL (PF) 0.5 % IJ SOLN
INTRAMUSCULAR | Status: DC | PRN
  Administered 2015-06-15: 3 mL

## 2015-06-15 MED ORDER — METOCLOPRAMIDE HCL 5 MG/ML IJ SOLN: 5.0000 mg | Freq: Three times a day (TID) | INTRAMUSCULAR | Status: DC | PRN

## 2015-06-15 MED ORDER — LACTATED RINGERS IV SOLN
INTRAVENOUS | Status: DC | PRN
  Administered 2015-06-15: 09:00:00 via INTRAVENOUS

## 2015-06-15 SURGICAL SUPPLY — 43 items
BLADE SAW 1/2 (BLADE) ×2 IMPLANT
BNDG COHESIVE 6X5 TAN STRL LF (GAUZE/BANDAGES/DRESSINGS) ×4 IMPLANT
CANISTER SUCT 1200ML W/VALVE (MISCELLANEOUS) ×2 IMPLANT
CAPT HIP TOTAL 3 ×2 IMPLANT
CATH FOL LEG HOLDER (MISCELLANEOUS) ×2 IMPLANT
CATH TRAY METER 16FR LF (MISCELLANEOUS) ×2 IMPLANT
CHLORAPREP W/TINT 26ML (MISCELLANEOUS) ×2 IMPLANT
DRAPE C-ARM XRAY 36X54 (DRAPES) ×2 IMPLANT
DRAPE INCISE IOBAN 66X60 STRL (DRAPES) IMPLANT
DRAPE POUCH INSTRU U-SHP 10X18 (DRAPES) ×2 IMPLANT
DRAPE SHEET LG 3/4 BI-LAMINATE (DRAPES) ×6 IMPLANT
DRAPE TABLE BACK 80X90 (DRAPES) ×2 IMPLANT
ELECT BLADE 6.5 EXT (BLADE) ×2 IMPLANT
GAUZE SPONGE 4X4 12PLY STRL (GAUZE/BANDAGES/DRESSINGS) ×2 IMPLANT
GLOVE BIOGEL PI IND STRL 9 (GLOVE) ×1 IMPLANT
GLOVE BIOGEL PI INDICATOR 9 (GLOVE) ×1
GLOVE SURG ORTHO 9.0 STRL STRW (GLOVE) ×2 IMPLANT
GOWN SPECIALTY ULTRA XL (MISCELLANEOUS) ×2 IMPLANT
GOWN STRL REUS W/ TWL LRG LVL3 (GOWN DISPOSABLE) ×1 IMPLANT
GOWN STRL REUS W/TWL LRG LVL3 (GOWN DISPOSABLE) ×1
HEMOVAC 400CC 10FR (MISCELLANEOUS) ×2 IMPLANT
HOOD PEEL AWAY FACE SHEILD DIS (HOOD) ×2 IMPLANT
MAT BLUE FLOOR 46X72 FLO (MISCELLANEOUS) ×2 IMPLANT
NDL SAFETY 18GX1.5 (NEEDLE) ×2 IMPLANT
NEEDLE SPNL 18GX3.5 QUINCKE PK (NEEDLE) ×2 IMPLANT
NS IRRIG 1000ML POUR BTL (IV SOLUTION) ×2 IMPLANT
PACK HIP COMPR (MISCELLANEOUS) ×2 IMPLANT
SOL PREP PVP 2OZ (MISCELLANEOUS) ×2
SOLUTION PREP PVP 2OZ (MISCELLANEOUS) ×1 IMPLANT
STAPLER SKIN PROX 35W (STAPLE) ×2 IMPLANT
STRAP SAFETY BODY (MISCELLANEOUS) ×2 IMPLANT
SUT DVC 2 QUILL PDO  T11 36X36 (SUTURE) ×1
SUT DVC 2 QUILL PDO T11 36X36 (SUTURE) ×1 IMPLANT
SUT DVC QUILL MONODERM 30X30 (SUTURE) ×2 IMPLANT
SUT ETHIBOND NAB CT1 #1 30IN (SUTURE) ×2 IMPLANT
SUT SILK 0 (SUTURE) ×1
SUT SILK 0 30XBRD TIE 6 (SUTURE) ×1 IMPLANT
SUT VIC AB 1 CT1 36 (SUTURE) ×2 IMPLANT
SYR 20CC LL (SYRINGE) ×2 IMPLANT
SYR 30ML LL (SYRINGE) ×2 IMPLANT
TAPE MICROFOAM 4IN (TAPE) ×2 IMPLANT
TUBE KAMVAC SUCTION (TUBING) ×2 IMPLANT
WATER STERILE IRR 1000ML POUR (IV SOLUTION) ×2 IMPLANT

## 2015-06-15 NOTE — H&P (Signed)
Reviewed paper H+P, will be scanned into chart. No changes noted.  

## 2015-06-15 NOTE — Anesthesia Preprocedure Evaluation (Addendum)
Anesthesia Evaluation  Patient identified by MRN, date of birth, ID band Patient awake    Reviewed: Allergy & Precautions, NPO status , Patient's Chart, lab work & pertinent test results, reviewed documented beta blocker date and time   Airway Mallampati: II  TM Distance: >3 FB     Dental  (+) Chipped   Pulmonary           Cardiovascular hypertension, Pt. on medications      Neuro/Psych    GI/Hepatic   Endo/Other  diabetes, Type 2  Renal/GU Renal InsufficiencyRenal disease     Musculoskeletal  (+) Arthritis ,   Abdominal   Peds  Hematology  (+) anemia ,   Anesthesia Other Findings EKG reviewed. No cardiac symptoms. Will use non latex.  Reproductive/Obstetrics                            Anesthesia Physical Anesthesia Plan  ASA: II  Anesthesia Plan: Spinal   Post-op Pain Management:    Induction:   Airway Management Planned:   Additional Equipment:   Intra-op Plan:   Post-operative Plan:   Informed Consent: I have reviewed the patients History and Physical, chart, labs and discussed the procedure including the risks, benefits and alternatives for the proposed anesthesia with the patient or authorized representative who has indicated his/her understanding and acceptance.     Plan Discussed with: CRNA  Anesthesia Plan Comments:         Anesthesia Quick Evaluation

## 2015-06-15 NOTE — Op Note (Signed)
06/15/2015  9:13 AM  PATIENT:  Lori Nguyen  59 y.o. female  PRE-OPERATIVE DIAGNOSIS:  OSTEOARTHRITIS primary osteoarthritis left hip  POST-OPERATIVE DIAGNOSIS:  OSTEOARTHRITIS same  PROCEDURE:  Procedure(s): TOTAL HIP ARTHROPLASTY ANTERIOR APPROACH (Left)  SURGEON: Laurene Footman, MD  ASSISTANTS: None  ANESTHESIA:   spinal  EBL:  Total I/O In: 1300 [I.V.:1300] Out: 450 [Urine:150; Blood:300]  BLOOD ADMINISTERED:none  DRAINS: none   LOCAL MEDICATIONS USED:  MARCAINE     SPECIMEN:  Source of Specimen:  Left femoral head showing extensive degenerative changes  DISPOSITION OF SPECIMEN:  PATHOLOGY  COUNTS:  YES  TOURNIQUET:  * No tourniquets in log *  IMPLANTS: Medacta AMIS 1 stem with 50 mm Mpact cup DM with liner and M 28 mm head  DICTATION: .Dragon Dictation   The patient was brought to the operating room and after spinal anesthesia was obtained patient was placed on the operative table with the ipsilateral foot into the Medacta attachment, contralateral leg on a well-padded table. C-arm was brought in and preop template x-ray taken. After prepping and draping in usual sterile fashion appropriate patient identification and timeout procedures were completed. Anterior approach to the hip was obtained and centered over the greater trochanter and TFL muscle. The subcutaneous tissue was incised hemostasis being achieved by electrocautery. TFL fascia was incised and the muscle retracted laterally deep retractor placed. The lateral femoral circumflex vessels were identified and ligated. The anterior capsule was exposed and a capsulotomy performed. The neck was identified and a femoral neck cut carried out with a saw. The head was removed without difficulty and showed sclerotic femoral head and acetabulum. Reaming was carried out to 48 mm mm and a 50 mm cup trial gave appropriate tightness to the acetabular component a 50 Mpact cup was impacted into position. The leg was then  externally rotated and ischiofemoral and patellofemoral releases carried out. The femur was sequentially broached to a size 1, size 1 stem and S head with standard neck trials were placed and the final components chosen. The 1 stem was inserted along with a M 28 mm head and 50 mm liner. The hip was reduced and was stable the wound was thoroughly irrigated with a dilute Betadine solution. The deep fascia was closed using a heavy Quill after infiltration of 30 cc of quarter percent Sensorcaine with epinephrine. 2-0 Quill to close the skin with skin staples Xeroform and honeycomb dressing applied  PLAN OF CARE: Admit to inpatient

## 2015-06-15 NOTE — Evaluation (Signed)
Physical Therapy Evaluation Patient Details Name: Lori Nguyen MRN: [id number] DOB: 06/20/1956 Today's Date: 06/15/2015   History of Present Illness  Pt underwent L THR anterior approach without reported post-op complications. She is POD#0 at time of evaluation. Evaluation attempted earlier in PM however sensation had not fully returned. Pt reporting high levels of pain and nausea at time of evaluation.  Clinical Impression  Pt demonstrates high levels of pain during evaluation which limit participation. Pt agrees to complete all bed exercises and mobility but requires rest breaks due to nausea and pain. Pt is able to transfer to recliner but requires modA+2 for safety due to instability, weakness, and lightheadedness. Vitals remain WNL during position changes and pt does not vomit during session. Pt reports need for SNF placement after [facility] January 2016 and will likely need SNF placement following discharge currently due to weakness, pain, and limited functional mobility. Will continue to update discharge recommendations as appropriate. Pt will benefit from skilled PT services to address deficits in strength, balance, and mobility in order to return to full function at home.     Follow Up Recommendations SNF    Equipment Recommendations  None recommended by PT    Recommendations for Other Services       Precautions / Restrictions Precautions Precautions: Anterior Hip;Fall Precaution Booklet Issued: Yes (comment) Restrictions Weight Bearing Restrictions: Yes LLE Weight Bearing: Weight bearing as tolerated      Mobility  Bed Mobility Overal bed mobility: Needs Assistance Bed Mobility: Supine to Sit     Supine to sit: Mod assist     General bed mobility comments: Pt with increased L hip pain with bed mobility. Requires assist for LEs as well as trunk righting due to pain with weight shifting to LLE  Transfers Overall transfer level: Needs assistance Equipment used: Rolling  walker (2 wheeled) Transfers: Sit to/from Stand Sit to Stand: Mod assist;+2 safety/equipment         General transfer comment: Pt is lightheaded once upright at EOB. Vitals taken and are roughly WNL but no hypotension noted. Pt with increased L hip pain with transfers and difficulty receiving weight onto LLE. Pt reports persistent dizziness in standing. +2 present for safety. Pt performs standing transfer to recliner with a few small steps but no significant ambulation. Pt reports 10/10 pain at rest and with activity.   Ambulation/Gait             General Gait Details: Pt only able to take a few small steps from bed to recliner due to pain and weakness on this date. No significant ambulation performed  Stairs            Wheelchair Mobility    Modified Rankin (Stroke Patients Only)       Balance Overall balance assessment: Needs assistance Sitting-balance support: Bilateral upper extremity supported Sitting balance-Leahy Scale: Fair     Standing balance support: Bilateral upper extremity supported Standing balance-Leahy Scale: Poor                               Pertinent Vitals/Pain Pain Assessment: 0-10 Pain Score: 10-Worst pain ever Pain Location: L hip Pain Intervention(s): Premedicated before session;Monitored during session;Limited activity within patient's tolerance    Home Living Family/patient expects to be discharged to:: Skilled nursing facility Living Arrangements: Spouse/significant other Available Help at Discharge: Family Type of Home: House Home Access: Stairs to enter Entrance Stairs-Rails: Right Entrance Stairs-Number of Steps:  7 Home Layout: One level Home Equipment: Walker - 2 wheels;Bedside commode (no shower seat, no grab bars)      Prior Function Level of Independence: Needs assistance   Gait / Transfers Assistance Needed: Limited community ambulation with single point cane  ADL's / Homemaking Assistance Needed:  Independent ADLs, assist IADLs        Hand Dominance   Dominant Hand: Right    Extremity/Trunk Assessment   Upper Extremity Assessment: Overall WFL for tasks assessed           Lower Extremity Assessment: LLE deficits/detail   LLE Deficits / Details: RLE appears grossly WFL but difficult to assess due to high levels of pain. LLE: unable to perform SLR without heavy assistance. SAQ full without assistance. DF/PF full without assist. Pt reports some residual vental foot numbness s/p epidural but otherwise sensation has returned.     Communication   Communication: No difficulties  Cognition Arousal/Alertness: Awake/alert Behavior During Therapy: WFL for tasks assessed/performed Overall Cognitive Status: Within Functional Limits for tasks assessed                      General Comments      Exercises Total Joint Exercises Ankle Circles/Pumps: Strengthening;Both;10 reps;Supine Quad Sets: Strengthening;Both;10 reps;Supine Gluteal Sets: Strengthening;Both;10 reps;Supine Towel Squeeze: Strengthening;Both;10 reps;Supine Short Arc Quad: Strengthening;Both;10 reps;Supine Hip ABduction/ADduction: Strengthening;Both;10 reps;Supine Straight Leg Raises: Strengthening;Both;10 reps;Supine      Assessment/Plan    PT Assessment Patient needs continued PT services  PT Diagnosis Difficulty walking;Abnormality of gait;Generalized weakness;Acute pain   PT Problem List Decreased strength;Decreased activity tolerance;Decreased balance;Decreased mobility;Pain  PT Treatment Interventions DME instruction;Gait training;Stair training;Functional mobility training;Therapeutic exercise;Therapeutic activities;Balance training;Neuromuscular re-education;Patient/family education;Manual techniques   PT Goals (Current goals can be found in the Care Plan section) Acute Rehab PT Goals Patient Stated Goal: "I want to move better" PT Goal Formulation: With patient Time For Goal Achievement:  06/29/15 Potential to Achieve Goals: Good    Frequency BID   Barriers to discharge Inaccessible home environment 7 steps to enter with unilateral railing    Co-evaluation               End of Session Equipment Utilized During Treatment: Gait belt Activity Tolerance: Patient limited by pain Patient left: in chair;with call bell/phone within reach;with chair alarm set;with family/visitor present;with SCD's reapplied (ice pack on hip) Nurse Communication: Mobility status         Time: 1500-1530 PT Time Calculation (min) (ACUTE ONLY): 30 min   Charges:   PT Evaluation $Initial PT Evaluation Tier I: 1 Procedure PT Treatments $Therapeutic Exercise: 8-22 mins   PT G Codes:       Lyndel Safe Arizbeth Cawthorn PT, DPT   Kyro Joswick 06/15/2015, 3:42 PM

## 2015-06-15 NOTE — Transfer of Care (Signed)
Immediate Anesthesia Transfer of Care Note  Patient: Lori Nguyen  Procedure(s) Performed: Procedure(s): TOTAL HIP ARTHROPLASTY ANTERIOR APPROACH (Left)  Patient Location: PACU  Anesthesia Type:Spinal  Level of Consciousness: sedated  Airway & Oxygen Therapy: Patient Spontanous Breathing and Patient connected to face mask oxygen  Post-op Assessment: Report given to RN and Post -op Vital signs reviewed and stable  Post vital signs: Reviewed and stable  Last Vitals:  Filed Vitals:   06/15/15 0609  BP: 145/74  Pulse: 84  Temp: 36.9 C  Resp: 16    Complications: No apparent anesthesia complications

## 2015-06-15 NOTE — Anesthesia Procedure Notes (Signed)
Spinal Patient location during procedure: OR Start time: 06/15/2015 7:57 AM End time: 06/15/2015 7:35 AM Staffing Resident/CRNA: Averly Ericson Performed by: resident/CRNA  Preanesthetic Checklist Completed: patient identified, site marked, surgical consent, pre-op evaluation, timeout performed, IV checked, risks and benefits discussed and monitors and equipment checked Spinal Block Patient position: sitting Prep: Betadine Patient monitoring: heart rate, continuous pulse ox, blood pressure and cardiac monitor Approach: midline Location: L4-5 Injection technique: single-shot Needle Needle type: Whitacre and Introducer  Needle gauge: 24 G Needle length: 9 cm Additional Notes Negative paresthesia. Negative blood return. Positive free-flowing CSF. Expiration date of kit checked and confirmed. Patient tolerated procedure well, without complications.

## 2015-06-16 NOTE — Anesthesia Postprocedure Evaluation (Signed)
  Anesthesia Post-op Note  Patient: Lori Nguyen  Procedure(s) Performed: Procedure(s): TOTAL HIP ARTHROPLASTY ANTERIOR APPROACH (Left)  Anesthesia type:Spinal  Patient location: 343A  Post pain: Pain level controlled  Post assessment: Post-op Vital signs reviewed, Patient's Cardiovascular Status Stable, Respiratory Function Stable, Patent Airway and No signs of Nausea or vomiting  Post vital signs: Reviewed and stable  Last Vitals:  Filed Vitals:   06/16/15 0332  BP: 129/61  Pulse: 96  Temp: 37.8 C  Resp: 18    Level of consciousness: awake, alert  and patient cooperative  Complications: No apparent anesthesia complications

## 2015-06-16 NOTE — Progress Notes (Signed)
Clinical Social Worker (CSW) presented bed offers to patient and her husband Lori Nguyen. They chose [facility]. Per [facility] admissions coordinator patient will have no co-pays. Patient and husband are aware of above. CSW will continue to follow and assist as needed.   Blima Rich, [place] [phone]

## 2015-06-16 NOTE — NC FL2 (Signed)
[place] LEVEL OF CARE SCREENING TOOL     IDENTIFICATION  Patient Name: Lori Nguyen Birthdate: 03-02-1956 Sex: female Admission Date (Current Location): 06/15/2015  [place] and [place] Number:  [facility] )   Facility and Address:  [facility], [address], [place], [place] [address]      Provider Number: [id number])  Attending Physician Name and Address:  Hessie Knows, MD  Relative Name and Phone Number:       Current Level of Care: Hospital Recommended Level of Care: [place] Prior Approval Number:    Date Approved/Denied:   PASRR Number:  ([id number])  Discharge Plan: SNF    Current Diagnoses: Patient Active Problem List   Diagnosis Date Noted  . Primary osteoarthritis of left hip 06/15/2015  Allergic state Chickenpox Hypertension Kidney stones Polycystic kidney disease  Orientation ACTIVITIES/SOCIAL BLADDER RESPIRATION    Self, Time, Situation, Place  Active Continent Normal  BEHAVIORAL SYMPTOMS/MOOD NEUROLOGICAL BOWEL NUTRITION STATUS   (none )  (none ) Continent Diet (Diet: Carb Modified. )  PHYSICIAN VISITS COMMUNICATION OF NEEDS Height & Weight Skin  30 days Verbally 4\' 11"  (149.9 cm) 160 lbs. Surgical wounds (Incision: Left Hip. )          AMBULATORY STATUS RESPIRATION    Assist extensive Normal      Personal Care Assistance Level of Assistance  Bathing, Feeding, Dressing Bathing Assistance: Limited assistance Feeding assistance: Independent Dressing Assistance: Limited assistance      Functional Limitations Info  Sight, Hearing, Speech Sight Info: Adequate Hearing Info: Adequate Speech Info: Adequate       SPECIAL CARE FACTORS FREQUENCY  PT (By licensed PT), OT (By licensed OT)     PT Frequency:  (5) OT Frequency:  (5)           Additional Factors Info  Code Status, Allergies Code Status Info:  (Full Code. ) Allergies Info:  (Latex)            Current Medications (06/16/2015): Current Facility-Administered Medications  Medication Dose Route Frequency Provider Last Rate Last Dose  . 0.9 %  sodium chloride infusion   Intravenous Continuous Hessie Knows, MD 100 mL/hr at 06/16/15 0756    . acetaminophen (TYLENOL) tablet 650 mg  650 mg Oral Q6H PRN Hessie Knows, MD       Or  . acetaminophen (TYLENOL) suppository 650 mg  650 mg Rectal Q6H PRN Hessie Knows, MD      . alum & mag hydroxide-simeth (MAALOX/MYLANTA) 200-200-20 MG/5ML suspension 30 mL  30 mL Oral Q4H PRN Hessie Knows, MD      . aspirin EC tablet 81 mg  81 mg Oral Daily Hessie Knows, MD   81 mg at 06/16/15 0749  . bisacodyl (DULCOLAX) suppository 10 mg  10 mg Rectal Daily PRN Hessie Knows, MD      . cholecalciferol (VITAMIN D) tablet 1,000 Units  1,000 Units Oral Daily Hessie Knows, MD   1,000 Units at 06/15/15 1148  . cyclobenzaprine (FLEXERIL) tablet 10 mg  10 mg Oral TID PRN Hessie Knows, MD      . docusate sodium (COLACE) capsule 100 mg  100 mg Oral BID Hessie Knows, MD   100 mg at 06/15/15 1152  . enoxaparin (LOVENOX) injection 40 mg  40 mg Subcutaneous Q24H Hessie Knows, MD   40 mg at 06/16/15 0749  . losartan (COZAAR) tablet 100 mg  100 mg Oral Daily Hessie Knows, MD   100 mg  at 06/15/15 1152   And  . hydrochlorothiazide (HYDRODIURIL) tablet 25 mg  25 mg Oral Daily Hessie Knows, MD   25 mg at 06/15/15 1152  . magnesium citrate solution 1 Bottle  1 Bottle Oral Once PRN Hessie Knows, MD      . magnesium hydroxide (MILK OF MAGNESIA) suspension 30 mL  30 mL Oral Daily PRN Hessie Knows, MD      . menthol-cetylpyridinium (CEPACOL) lozenge 3 mg  1 lozenge Oral PRN Hessie Knows, MD       Or  . phenol (CHLORASEPTIC) mouth spray 1 spray  1 spray Mouth/Throat PRN Hessie Knows, MD      . metoCLOPramide (REGLAN) tablet 5-10 mg  5-10 mg Oral Q8H PRN Hessie Knows, MD       Or  . metoCLOPramide (REGLAN) injection 5-10 mg  5-10 mg Intravenous Q8H PRN Hessie Knows, MD      . morphine  2 MG/ML injection 2 mg  2 mg Intravenous Q1H PRN Hessie Knows, MD   2 mg at 06/15/15 1941  . ondansetron (ZOFRAN) tablet 4 mg  4 mg Oral Q6H PRN Hessie Knows, MD       Or  . ondansetron [facility]) injection 4 mg  4 mg Intravenous Q6H PRN Hessie Knows, MD   4 mg at 06/15/15 1439  . oxyCODONE (Oxy IR/ROXICODONE) immediate release tablet 5-10 mg  5-10 mg Oral Q3H PRN Hessie Knows, MD   5 mg at 06/16/15 0749  . pantoprazole (PROTONIX) EC tablet 40 mg  40 mg Oral Daily Hessie Knows, MD       Do not use this list as official medication orders. Please verify with discharge summary.  Discharge Medications:   Medication List    ASK your doctor about these medications        aspirin EC 81 MG tablet  Take 81 mg by mouth daily.     Biotin 10 MG Caps  Take 1 capsule by mouth daily.     cyclobenzaprine 10 MG tablet  Commonly known as:  FLEXERIL  Take 10 mg by mouth 3 (three) times daily as needed for muscle spasms.     etodolac 400 MG tablet  Commonly known as:  LODINE  Take 400 mg by mouth 2 (two) times daily.     Green Tea 315 MG Caps  Take 2 capsules by mouth daily.     Iron 142 (45 FE) MG Tbcr  Take 1 tablet by mouth daily.     losartan-hydrochlorothiazide 100-25 MG tablet  Commonly known as:  HYZAAR  Take 1 tablet by mouth daily.     pantoprazole 40 MG tablet  Commonly known as:  PROTONIX  Take 40 mg by mouth daily.     traMADol 50 MG tablet  Commonly known as:  ULTRAM  Take 50 mg by mouth 3 (three) times daily.     Vitamin D3 5000 UNITS Caps  Take 1 capsule by mouth daily.        Relevant Imaging Results:  Relevant Lab Results:  Recent Labs    Additional Information  (SSN: [id number])  Lori Freshwater, LCSW

## 2015-06-16 NOTE — Care Management Note (Signed)
Case Management Note  Patient Details  Name: Lori Nguyen MRN: [id number] Date of Birth: 1956/04/30  Subjective/Objective:                  Met with patient to discuss discharge planning. PT is currently recommending SNF and patient says she has been to [facility] in the past. She is from home with her husband. She has a rolling walker and bedside commode. She uses [facility] for Rx. She states she has no history of home health. Action/Plan: List of home health agencies left with patient. RNCM will continue to follow   Expected Discharge Date:                  Expected Discharge Plan:     In-House Referral:  Clinical Social Work  Discharge planning Services  CM Consult  Post Acute Care Choice:  Home Health Choice offered to:  Patient  DME Arranged:    DME Agency:     HH Arranged:    [place] Agency:     Status of Service:  In process, will continue to follow  Medicare Important Message Given:    Date Medicare IM Given:    Medicare IM give by:    Date Additional Medicare IM Given:    Additional Medicare Important Message give by:     If discussed at [place] of Stay Meetings, dates discussed:    Additional Comments:  Marshell Garfinkel, RN 06/16/2015, 11:03 AM

## 2015-06-16 NOTE — Progress Notes (Signed)
Physical Therapy Treatment Patient Details Name: Lori Nguyen MRN: [id number] DOB: 14-Nov-1955 Today's Date: 06/16/2015    History of Present Illness 59 year old female who came to [facility] for a L THR anterior approach.    PT Comments    Patient continues to be very limited in bed mobility, gait, and transfers by pain. She demonstrates need for assistance to bring her LLE over the threshold for supine to sit transfer and requires assistance for sit to stand transfers secondary to decreased force output from LLE. Patient is able to pivot on her LLE for transfers to bedside commode and recliner, however she struggles to advance LLE as it feels "weak". Patient demonstrates increase in pain with there-ex on her LLE, though she is able to provide good effort with each movement. Patient demonstrates clear deficits in mobility from her baseline and at this time would continue to benefit from skilled PT services to address the above deficits.   Follow Up Recommendations  SNF     Equipment Recommendations  None recommended by PT    Recommendations for Other Services       Precautions / Restrictions Precautions Precautions: Anterior Hip;Fall Precaution Booklet Issued: Yes (comment) (Per PT) Restrictions Weight Bearing Restrictions: Yes LLE Weight Bearing: Weight bearing as tolerated    Mobility  Bed Mobility Overal bed mobility: Needs Assistance Bed Mobility: Supine to Sit     Supine to sit: Min assist;Mod assist     General bed mobility comments: Patient requires assistance with LLE coming to EOB secondary to pain/weakness. No assistance for trunk required, though cuing provided to use hand rails and elevate trunk through UEs.   Transfers Overall transfer level: Needs assistance Equipment used: Rolling walker (2 wheeled) Transfers: Sit to/from Stand Sit to Stand: Min assist;Mod assist         General transfer comment: Pt initially hesitant with standing, no loss of balance  noted, though significant weight shifting to RLE secondary to pain in LLE noted. No complaints of dizziness reported.   Ambulation/Gait Ambulation/Gait assistance: Min guard Ambulation Distance (Feet): 3 Feet Assistive device: Rolling walker (2 wheeled) Gait Pattern/deviations: Step-to pattern   Gait velocity interpretation: <1.8 ft/sec, indicative of risk for recurrent falls General Gait Details: Patient really only able to pivot from bed to bedside commode and from bedside commode back to recliner during this session secondary to pain.    Stairs            Wheelchair Mobility    Modified Rankin (Stroke Patients Only)       Balance Overall balance assessment: Needs assistance Sitting-balance support: Bilateral upper extremity supported Sitting balance-Leahy Scale: Good Sitting balance - Comments: No deficits noted    Standing balance support: Bilateral upper extremity supported Standing balance-Leahy Scale: Fair Standing balance comment: Patient does not lose balance in standing with RW, decreased step length with LLE noted secondary to weakness/pain.                     Cognition Arousal/Alertness: Awake/alert Behavior During Therapy: WFL for tasks assessed/performed Overall Cognitive Status: Within Functional Limits for tasks assessed                      Exercises Total Joint Exercises Ankle Circles/Pumps: AROM;Both;10 reps Quad Sets: AROM;Left;10 reps Gluteal Sets: AROM;Both;10 reps Heel Slides: AROM;AAROM;Both;10 reps Straight Leg Raises: AROM;AAROM;Both;10 reps Long Arc Quad: AROM;Both;10 reps    General Comments        Pertinent  Vitals/Pain Pain Assessment:  (Pt does not rate pain, but grimaces throughout this session and limits mobility as a result of pain. ) Pain Location: L hip  Pain Intervention(s): Limited activity within patient's tolerance;Monitored during session;Premedicated before session;Repositioned    Home Living  Family/patient expects to be discharged to:: Skilled nursing facility Living Arrangements: Spouse/significant other Available Help at Discharge: Family Type of Home: House Home Access: Stairs to enter Entrance Stairs-Rails: Right Home Layout: One level Home Equipment: Environmental consultant - 2 wheels;Bedside commode      Prior Function Level of Independence: Needs assistance  Gait / Transfers Assistance Needed: Limited community ambulation with single point cane ADL's / Homemaking Assistance Needed: Independent ADLs, assist IADLs     PT Goals (current goals can now be found in the care plan section) Acute Rehab PT Goals Patient Stated Goal: To move better PT Goal Formulation: With patient Time For Goal Achievement: 06/29/15 Potential to Achieve Goals: Good Progress towards PT goals: Progressing toward goals    Frequency  BID    PT Plan Current plan remains appropriate    Co-evaluation             End of Session Equipment Utilized During Treatment: Gait belt Activity Tolerance: Patient limited by pain Patient left: in chair;with call bell/phone within reach;with SCD's reapplied;with chair alarm set     Time: [id number] PT Time Calculation (min) (ACUTE ONLY): 23 min  Charges:  $Gait Training: 8-22 mins $Therapeutic Exercise: 8-22 mins                    G Codes:      Kerman Passey, PT, DPT    06/16/2015, 2:51 PM

## 2015-06-16 NOTE — Evaluation (Signed)
Occupational Therapy Evaluation Patient Details Name: Lori Nguyen MRN: [id number] DOB: [birth year] Today's Date: 06/16/2015    History of Present Illness 59 year old female who came to [facility] for a L THR anterior approach.   Clinical Impression   This patient is a 59 year old female who came to [facility] for a L total knee replacement.  Patient lives in a 1 story home with 7 steps to enter with her husband.  She had been independent with BADL but got assist with IADL. She now requires more assistance and would benefit from Occupational Therapy for ADL/functioal mobility training .      Follow Up Recommendations       Equipment Recommendations       Recommendations for Other Services       Precautions / Restrictions Precautions Precautions: Anterior Hip;Fall Precaution Booklet Issued: Yes (comment) (Per PT) Restrictions Weight Bearing Restrictions: Yes LLE Weight Bearing: Weight bearing as tolerated      Mobility Bed Mobility                  Transfers                      Balance                                            ADL                                         General ADL Comments: Had been independent with BADL needed assist with IADL. Today practiced with hip kit for techniques for lower body dressing with hand over hand assist to illustrate technique to Donned/doffed socks and pants to knees.     Vision     Perception     Praxis      Pertinent Vitals/Pain       Hand Dominance Right   Extremity/Trunk Assessment Upper Extremity Assessment Upper Extremity Assessment: Overall WFL for tasks assessed   Lower Extremity Assessment Lower Extremity Assessment: Defer to PT evaluation       Communication Communication Communication: No difficulties   Cognition Arousal/Alertness: Awake/alert Behavior During Therapy: WFL for tasks assessed/performed Overall Cognitive  Status: Within Functional Limits for tasks assessed                     General Comments       Exercises       Shoulder Instructions      Home Living Family/patient expects to be discharged to:: Skilled nursing facility Living Arrangements: Spouse/significant other Available Help at Discharge: Family Type of Home: House Home Access: Stairs to enter [employer] of Steps: 7 Entrance Stairs-Rails: Right Home Layout: One level     Bathroom Shower/Tub: Tub/shower unit         Home Equipment: Environmental consultant - 2 wheels;Bedside commode          Prior Functioning/Environment Level of Independence: Needs assistance  Gait / Transfers Assistance Needed: Limited community ambulation with single point cane ADL's / Homemaking Assistance Needed: Independent ADLs, assist IADLs        OT Diagnosis: Acute pain   OT Problem List:     OT Treatment/Interventions: Self-care/ADL training    OT Goals(Current  goals can be found in the care plan section) Acute Rehab OT Goals Patient Stated Goal: To move better OT Goal Formulation: With patient Time For Goal Achievement: 06/30/15 Potential to Achieve Goals: Good  OT Frequency: Min 1X/week   Barriers to D/C:            Co-evaluation              End of Session Equipment Utilized During Treatment:  (hip kit)  Activity Tolerance:   Patient left: in bed;with call bell/phone within reach;with bed alarm set   Time: 1055-1108 OT Time Calculation (min): 13 min Charges:  OT General Charges $OT Visit: 1 Procedure OT Evaluation $Initial OT Evaluation Tier I: 1 Procedure G-Codes:    Myrene Galas, MS/OTR/L  06/16/2015, 11:11 AM

## 2015-06-16 NOTE — Progress Notes (Signed)
  Subjective: 1 Day Post-Op Procedure(s) (LRB): TOTAL HIP ARTHROPLASTY ANTERIOR APPROACH (Left) Patient reports pain as severe.   Patient seen in rounds with Dr. Rudene Christians. Patient is well, and has had no acute complaints or problems Plan is to go Rehab after hospital stay. The patient would prefer to go home, but physical therapy is recommending rehabilitation. Negative for chest pain and shortness of breath Fever: no Gastrointestinal: Negative for nausea and vomiting  Objective: Vital signs in last 24 hours: Temp:  [96 F (35.6 C)-100 F (37.8 C)] 100 F (37.8 C) (11/16 0332) Pulse Rate:  [71-96] 96 (11/16 0332) Resp:  [12-23] 18 (11/16 0332) BP: (89-138)/(46-99) 129/61 mmHg (11/16 0332) SpO2:  [96 %-100 %] 96 % (11/16 0332) FiO2 (%):  [21 %] 21 % (11/15 1122)  Intake/Output from previous day:  Intake/Output Summary (Last 24 hours) at 06/16/15 0732 Last data filed at 06/16/15 0400  Gross per 24 hour  Intake 4732.5 ml  Output   2675 ml  Net 2057.5 ml    Intake/Output this shift:    Labs:  Recent Labs  06/15/15 1201  HGB 10.7*    Recent Labs  06/15/15 1201  WBC 11.2*  RBC 3.84  HCT 32.5*  PLT 164    Recent Labs  06/15/15 1201  CREATININE 0.75   No results for input(s): LABPT, INR in the last 72 hours.   EXAM General - Patient is Alert and Oriented Extremity - Neurovascular intact Dorsiflexion/Plantar flexion intact Compartment soft Dressing/Incision - clean, dry, no drainage Motor Function - intact, moving foot and toes well on exam.   Past Medical History  Diagnosis Date  . Hypertension   . Polycystic kidney   . Diabetes mellitus without complication ([place])     gestational diabetes in 71  . GERD (gastroesophageal reflux disease)   . Arthritis   . Anemia     Assessment/Plan: 1 Day Post-Op Procedure(s) (LRB): TOTAL HIP ARTHROPLASTY ANTERIOR APPROACH (Left) Active Problems:   Primary osteoarthritis of left hip  Estimated body mass index  is 32.3 kg/(m^2) as calculated from the following:   Height as of this encounter: 4\' 11"  (1.499 m).   Weight as of this encounter: 72.576 kg (160 lb). Advance diet Up with therapy  DVT Prophylaxis - Lovenox, Foot Pumps and TED hose Weight-Bearing as tolerated to Left leg  Reche Dixon, PA-C Orthopaedic Surgery 06/16/2015, 7:32 AM

## 2015-06-16 NOTE — Clinical Social Work Note (Signed)
Clinical Social Work Assessment  Patient Details  Name: Lori Nguyen MRN: [id number] Date of Birth: 09/12/1955  Date of referral:  06/16/15               Reason for consult:  Facility Placement                Permission sought to share information with:  Facility Contact Representative Permission granted to share information::  Yes, Verbal Permission Granted  Name::      Skilled Nursing Facility   Agency::   [facility]   Relationship::     Contact Information:     Housing/Transportation Living arrangements for the past 2 months:  Skilled Nursing Facility Source of Information:  Patient Patient Interpreter Needed:  None Criminal Activity/Legal Involvement Pertinent to Current Situation/Hospitalization:  No - Comment as needed Significant Relationships:  Adult Children, Spouse Lives with:  Spouse Do you feel safe going back to the place where you live?  Yes Need for family participation in patient care:  Yes (Comment)  Care giving concerns: Patient lives in [place] with her husband Lori Nguyen.    Social Worker assessment / plan:  Clinical Social Worker (CSW) received SNF consult. PT recommended SNF on post op day 0. PT has not worked with patient today. Per PT patient may progress to home health. CSW met with patient alone at bedside. Patient was laying in bed and was alert and oriented. CSW introduced self and explained role of CSW department. Patient reported that she lives in [place] with her husband Lori Nguyen. Patient reported that she has 2 adult children. Per patient she was at [facility] in January 2016 for 7 days for PT after a hip surgery. CSW explained that PT is recommending SNF. Patient is agreeable to SNF search and prefers [facility]. CSW encouraged patient to pick a second choice in case [facility] was full. SNF list was provided.   FL2 complete and faxed out. CSW will continue to follow and assist as needed.   Employment status:  Disabled (Comment on whether or not currently  receiving Disability) Insurance information:  Managed Care PT Recommendations:  Skilled Nursing Facility Information / Referral to community resources:  Skilled Nursing Facility  Patient/Family's Response to care:  Patient is agreeable to SNF search and prefers [facility].   Patient/Family's Understanding of and Emotional Response to Diagnosis, Current Treatment, and Prognosis: Patient was pleasant throughout assessment and thanked CSW for visit.   Emotional Assessment Appearance:  Appears stated age Attitude/Demeanor/Rapport:    Affect (typically observed):  Accepting, Adaptable, Pleasant Orientation:  Oriented to Self, Oriented to Place, Oriented to  Time, Oriented to Situation Alcohol / Substance use:  Not Applicable Psych involvement (Current and /or in the community):  No (Comment)  Discharge Needs  Concerns to be addressed:  Discharge Planning Concerns Readmission within the last 30 days:  No Current discharge risk:  Dependent with Mobility Barriers to Discharge:  Continued Medical Work up   Nguyen, Lori G, LCSW 06/16/2015, 11:27 AM  

## 2015-06-16 NOTE — NC FL2 (Deleted)
[place] LEVEL OF CARE SCREENING TOOL     IDENTIFICATION  Patient Name: Lori Nguyen Birthdate: 1955/10/08 Sex: female Admission Date (Current Location): 06/15/2015  [place] and [place] Number:  [facility] )   Facility and Address:  [facility], [address] [place], [place] [address]      Provider Number: [id number] [phone])  Attending Physician Name and Address:  Lori Knows, MD  Relative Name and Phone Number:       Current Level of Care: Hospital Recommended Level of Care: [place] Prior Approval Number:    Date Approved/Denied:   PASRR Number:  ([id number])  Discharge Plan: SNF    Current Diagnoses: Patient Active Problem List   Diagnosis Date Noted  . Primary osteoarthritis of left hip 06/15/2015    Orientation ACTIVITIES/SOCIAL BLADDER RESPIRATION    Self, Time, Situation, Place  Active Continent Normal  BEHAVIORAL SYMPTOMS/MOOD NEUROLOGICAL BOWEL NUTRITION STATUS   (none )  (none ) Continent Diet (Diet: Carb Modified. )  PHYSICIAN VISITS COMMUNICATION OF NEEDS Height & Weight Skin  30 days Verbally 4\' 11"  (149.9 cm) 160 lbs. Surgical wounds (Incision: Left Hip. )          AMBULATORY STATUS RESPIRATION    Assist extensive Normal      Personal Care Assistance Level of Assistance  Bathing, Feeding, Dressing Bathing Assistance: Limited assistance Feeding assistance: Independent Dressing Assistance: Limited assistance      Functional Limitations Info  Sight, Hearing, Speech Sight Info: Adequate Hearing Info: Adequate Speech Info: Adequate       SPECIAL CARE FACTORS FREQUENCY  PT (By licensed PT), OT (By licensed OT)     PT Frequency:  (5) OT Frequency:  (5)           Additional Factors Info  Code Status, Allergies Code Status Info:  (Full Code. ) Allergies Info:  (Latex)           Current Medications (06/16/2015): Current Facility-Administered Medications   Medication Dose Route Frequency Provider Last Rate Last Dose  . 0.9 %  sodium chloride infusion   Intravenous Continuous Lori Knows, MD 100 mL/hr at 06/16/15 0756    . acetaminophen (TYLENOL) tablet 650 mg  650 mg Oral Q6H PRN Lori Knows, MD       Or  . acetaminophen (TYLENOL) suppository 650 mg  650 mg Rectal Q6H PRN Lori Knows, MD      . alum & mag hydroxide-simeth (MAALOX/MYLANTA) 200-200-20 MG/5ML suspension 30 mL  30 mL Oral Q4H PRN Lori Knows, MD      . aspirin EC tablet 81 mg  81 mg Oral Daily Lori Knows, MD   81 mg at 06/16/15 0749  . bisacodyl (DULCOLAX) suppository 10 mg  10 mg Rectal Daily PRN Lori Knows, MD      . cholecalciferol (VITAMIN D) tablet 1,000 Units  1,000 Units Oral Daily Lori Knows, MD   1,000 Units at 06/15/15 1148  . cyclobenzaprine (FLEXERIL) tablet 10 mg  10 mg Oral TID PRN Lori Knows, MD      . docusate sodium (COLACE) capsule 100 mg  100 mg Oral BID Lori Knows, MD   100 mg at 06/15/15 1152  . enoxaparin (LOVENOX) injection 40 mg  40 mg Subcutaneous Q24H Lori Knows, MD   40 mg at 06/16/15 0749  . losartan (COZAAR) tablet 100 mg  100 mg Oral Daily Lori Knows, MD   100 mg at 06/15/15 1152   And  .  hydrochlorothiazide (HYDRODIURIL) tablet 25 mg  25 mg Oral Daily Lori Knows, MD   25 mg at 06/15/15 1152  . magnesium citrate solution 1 Bottle  1 Bottle Oral Once PRN Lori Knows, MD      . magnesium hydroxide (MILK OF MAGNESIA) suspension 30 mL  30 mL Oral Daily PRN Lori Knows, MD      . menthol-cetylpyridinium (CEPACOL) lozenge 3 mg  1 lozenge Oral PRN Lori Knows, MD       Or  . phenol (CHLORASEPTIC) mouth spray 1 spray  1 spray Mouth/Throat PRN Lori Knows, MD      . metoCLOPramide (REGLAN) tablet 5-10 mg  5-10 mg Oral Q8H PRN Lori Knows, MD       Or  . metoCLOPramide (REGLAN) injection 5-10 mg  5-10 mg Intravenous Q8H PRN Lori Knows, MD      . morphine 2 MG/ML injection 2 mg  2 mg Intravenous Q1H PRN Lori Knows, MD   2 mg at  06/15/15 1941  . ondansetron (ZOFRAN) tablet 4 mg  4 mg Oral Q6H PRN Lori Knows, MD       Or  . ondansetron [facility]) injection 4 mg  4 mg Intravenous Q6H PRN Lori Knows, MD   4 mg at 06/15/15 1439  . oxyCODONE (Oxy IR/ROXICODONE) immediate release tablet 5-10 mg  5-10 mg Oral Q3H PRN Lori Knows, MD   5 mg at 06/16/15 0749  . pantoprazole (PROTONIX) EC tablet 40 mg  40 mg Oral Daily Lori Knows, MD       Do not use this list as official medication orders. Please verify with discharge summary.  Discharge Medications:   Medication List    ASK your doctor about these medications        aspirin EC 81 MG tablet  Take 81 mg by mouth daily.     Biotin 10 MG Caps  Take 1 capsule by mouth daily.     cyclobenzaprine 10 MG tablet  Commonly known as:  FLEXERIL  Take 10 mg by mouth 3 (three) times daily as needed for muscle spasms.     etodolac 400 MG tablet  Commonly known as:  LODINE  Take 400 mg by mouth 2 (two) times daily.     Green Tea 315 MG Caps  Take 2 capsules by mouth daily.     Iron 142 (45 FE) MG Tbcr  Take 1 tablet by mouth daily.     losartan-hydrochlorothiazide 100-25 MG tablet  Commonly known as:  HYZAAR  Take 1 tablet by mouth daily.     pantoprazole 40 MG tablet  Commonly known as:  PROTONIX  Take 40 mg by mouth daily.     traMADol 50 MG tablet  Commonly known as:  ULTRAM  Take 50 mg by mouth 3 (three) times daily.     Vitamin D3 5000 UNITS Caps  Take 1 capsule by mouth daily.        Relevant Imaging Results:  Relevant Lab Results:  Recent Labs    Additional Information  (SSN: [id number])  Lori Freshwater, LCSW

## 2015-06-16 NOTE — Clinical Social Work Placement (Signed)
   CLINICAL SOCIAL WORK PLACEMENT  NOTE  Date:  06/16/2015  Patient Details  Name: Lori Nguyen MRN: [id number] Date of Birth: Aug 02, 1955  Clinical Social Work is seeking post-discharge placement for this patient at the [place] level of care (*CSW will initial, date and re-position this form in  chart as items are completed):  Yes   Patient/family provided with [place] Work Department's list of facilities offering this level of care within the geographic area requested by the patient (or if unable, by the patient's family).  Yes   Patient/family informed of their freedom to choose among providers that offer the needed level of care, that participate in Medicare, Medicaid or managed care program needed by the patient, have an available bed and are willing to accept the patient.  Yes   Patient/family informed of [facility]'s ownership interest in [facility] and [facility], as well as of the fact that they are under no obligation to receive care at these facilities.  PASRR submitted to EDS on       PASRR number received on       Existing PASRR number confirmed on 06/16/15     FL2 transmitted to all facilities in geographic area requested by pt/family on 06/16/15     FL2 transmitted to all facilities within larger geographic area on       Patient informed that his/her managed care company has contracts with or will negotiate with certain facilities, including the following:            Patient/family informed of bed offers received.  Patient chooses bed at       Physician recommends and patient chooses bed at      Patient to be transferred to   on  .  Patient to be transferred to facility by       Patient family notified on   of transfer.  Name of family member notified:        PHYSICIAN Please sign FL2     Additional Comment:    _______________________________________________ [name], LCSW 06/16/2015, 11:26  AM

## 2015-06-16 NOTE — Progress Notes (Signed)
Physical Therapy Treatment Patient Details Name: Lori Nguyen MRN: [id number] DOB: [birth year] Today's Date: 06/16/2015    History of Present Illness 59 year old female who came to [facility] for a L THR anterior approach.    PT Comments    Patient continues to demonstrate flat affect and poor motivation to participate with therapy. She does increase her ambulatory tolerance and demonstrates improved gait pattern. She terminates ambulation stating she felt dizzy and light-headed which she complained of yesterday. RN made aware, blood pressure WNL (124/57). Patient does participate in exercises, she continues to require AAROM secondary to weakness/poor effort. PT will continue to progress ambulation as tolerated.   Follow Up Recommendations  SNF     Equipment Recommendations  None recommended by PT    Recommendations for Other Services       Precautions / Restrictions Precautions Precautions: Anterior Hip;Fall Precaution Booklet Issued: Yes (comment) (Per PT) Restrictions Weight Bearing Restrictions: Yes LLE Weight Bearing: Weight bearing as tolerated    Mobility  Bed Mobility Overal bed mobility: Needs Assistance Bed Mobility: Sit to Supine     Supine to sit: Min assist;Mod assist Sit to supine: Min assist;Mod assist   General bed mobility comments: Patient requires assistance to bring LLE into bed, she is able to negotiate her trunk, though slowly.   Transfers Overall transfer level: Needs assistance Equipment used: Rolling walker (2 wheeled) Transfers: Sit to/from Stand Sit to Stand: Min guard;Supervision         General transfer comment: Patient is able to transfer from both recliner and bedside commode without external physical assistance and appropriate speed. No loss of balance noted and appropriate hand placement observed.   Ambulation/Gait Ambulation/Gait assistance: Min guard Ambulation Distance (Feet): 30 Feet Assistive device: Rolling walker (2  wheeled) Gait Pattern/deviations: Step-through pattern   Gait velocity interpretation: <1.8 ft/sec, indicative of risk for recurrent falls General Gait Details: Pt demonstrates decreased gait speed, though increased step length and step through pattern with ability to flex her knees to advance LEs. She begins to complain of pain halfway through ambulation and asks to sit down. Upon sitting down she begins to feel light headed, BP checked (124/57)   Stairs            Wheelchair Mobility    Modified Rankin (Stroke Patients Only)       Balance Overall balance assessment: Needs assistance Sitting-balance support: Bilateral upper extremity supported Sitting balance-Leahy Scale: Good Sitting balance - Comments: No deficits noted    Standing balance support: Bilateral upper extremity supported Standing balance-Leahy Scale: Fair Standing balance comment: Improved gait mechanics during this session, though decreased gait speed and complaints of dizziness/light headed ness increase her risk of falling.                     Cognition Arousal/Alertness: Awake/alert Behavior During Therapy: WFL for tasks assessed/performed;Flat affect Overall Cognitive Status: Within Functional Limits for tasks assessed                      Exercises Total Joint Exercises Ankle Circles/Pumps: AROM;Both;10 reps Quad Sets: AROM;Left;10 reps Gluteal Sets: AROM;Both;10 reps Heel Slides: AROM;AAROM;Both;10 reps Straight Leg Raises: AROM;AAROM;Both;10 reps Long Arc Quad: AROM;Both;10 reps Other Exercises Other Exercises: Transferred patient to and from bedside commode with cga x 1 and RW with cuing for technique and sequencing.     General Comments        Pertinent Vitals/Pain Pain Assessment:  (Pt reports her  pain is improved this session, though this increases throughout ambulation, RN notified. ) Pain Location: L hip  Pain Intervention(s): Limited activity within patient's  tolerance;Monitored during session;Premedicated before session;Repositioned;Ice applied    Home Living                      Prior Function            PT Goals (current goals can now be found in the care plan section) Acute Rehab PT Goals Patient Stated Goal: To move better PT Goal Formulation: With patient Time For Goal Achievement: 06/29/15 Potential to Achieve Goals: Good Progress towards PT goals: Progressing toward goals    Frequency  BID    PT Plan Current plan remains appropriate    Co-evaluation             End of Session Equipment Utilized During Treatment: Gait belt Activity Tolerance: Patient limited by pain;Patient limited by fatigue Patient left: in bed;with call bell/phone within reach;with bed alarm set;with nursing/sitter in room     Time: [id number] PT Time Calculation (min) (ACUTE ONLY): 24 min  Charges:  $Gait Training: 8-22 mins $Therapeutic Exercise: 8-22 mins                    G Codes:      Kerman Passey, PT, DPT    06/16/2015, 5:21 PM

## 2015-06-17 LAB — SURGICAL PATHOLOGY

## 2015-06-17 MED ORDER — ENOXAPARIN SODIUM 40 MG/0.4ML ~~LOC~~ SOLN: 40.0000 mg | SUBCUTANEOUS | Status: DC

## 2015-06-17 MED ORDER — OXYCODONE HCL 5 MG PO TABS: 5.0000 mg | ORAL_TABLET | ORAL | Status: DC | PRN

## 2015-06-17 NOTE — Progress Notes (Signed)
   Subjective: 2 Days Post-Op Procedure(s) (LRB): TOTAL HIP ARTHROPLASTY ANTERIOR APPROACH (Left) Patient reports pain as mild.   Patient is well, and has had no acute complaints or problems We will continue with therapy today.  Plan is to go Rehab after hospital stay. No CP,SOB, Abd pain.   Objective: Vital signs in last 24 hours: Temp:  [99 F (37.2 C)-100.4 F (38 C)] 99 F (37.2 C) (11/17 0739) Pulse Rate:  [94-104] 98 (11/17 0739) Resp:  [18] 18 (11/17 0739) BP: (120-135)/(59-73) 129/60 mmHg (11/17 0739) SpO2:  [95 %-97 %] 97 % (11/17 0739)  Intake/Output from previous day: 11/16 0701 - 11/17 0700 In: 600 [P.O.:600] Out: 300 [Urine:300] Intake/Output this shift:     Recent Labs  06/15/15 1201  HGB 10.7*    Recent Labs  06/15/15 1201  WBC 11.2*  RBC 3.84  HCT 32.5*  PLT 164    Recent Labs  06/15/15 1201  CREATININE 0.75   No results for input(s): LABPT, INR in the last 72 hours.  EXAM General - Patient is Alert, Appropriate and Oriented Extremity - Neurovascular intact Sensation intact distally Intact pulses distally Dorsiflexion/Plantar flexion intact Dressing - dressing C/D/I and scant drainage Motor Function - intact, moving foot and toes well on exam.   Past Medical History  Diagnosis Date  . Hypertension   . Polycystic kidney   . Diabetes mellitus without complication ([place])     gestational diabetes in 89  . GERD (gastroesophageal reflux disease)   . Arthritis   . Anemia     Assessment/Plan:   2 Days Post-Op Procedure(s) (LRB): TOTAL HIP ARTHROPLASTY ANTERIOR APPROACH (Left) Active Problems:   Primary osteoarthritis of left hip  Estimated body mass index is 32.3 kg/(m^2) as calculated from the following:   Height as of this encounter: 4\' 11"  (1.499 m).   Weight as of this encounter: 72.576 kg (160 lb). Advance diet Up with therapy Needs BM Plan on discharge to rehab pending BM   DVT Prophylaxis - Lovenox, Foot Pumps and  TED hose Weight-Bearing as tolerated to left leg D/C O2 and Pulse OX and try on Room Air  T. Rachelle Hora, PA-C [place] 06/17/2015, 8:13 AM

## 2015-06-17 NOTE — Progress Notes (Signed)
Occupational Therapy Treatment Patient Details Name: Lori Nguyen MRN: [id number] DOB: April 08, 1956 Today's Date: 06/17/2015    History of present illness Pt underwent L THR anterior approach without reported post-op complications   OT comments  Patient seen this date for self care tasks including lower body dressing with minimal assist and use of adaptive equipment, toileting with functional mobility to and from the bathroom with min guard as well as bathing private areas in standing at the sink with CGA for safety.  She continues to progress in all areas.  Slow to move at times and reports pain 6/10 in the hip.  Had pain meds prior to therapy.    Follow Up Recommendations       Equipment Recommendations       Recommendations for Other Services      Precautions / Restrictions Precautions Precautions: Anterior Hip;Fall Precaution Booklet Issued: Yes (comment) Restrictions Weight Bearing Restrictions: Yes LLE Weight Bearing: Weight bearing as tolerated       Mobility Bed Mobility               General bed mobility comments: Received and left upright in recliner  Transfers Overall transfer level: Needs assistance Equipment used: Rolling walker (2 wheeled) Transfers: Sit to/from Stand Sit to Stand: Min guard         General transfer comment: Pt demonstrates increased time required to come to standing due to pain and LE weakness. Decreased weight shift to LLE. CGA required for safety due to intermittent L hip buckling    Balance Overall balance assessment: Needs assistance   Sitting balance-Leahy Scale: Good       Standing balance-Leahy Scale: Fair                     ADL Overall ADL's : Needs assistance/impaired     Grooming: Set up;Min guard;Standing       Lower Body Bathing: Minimal assistance;Set up       Lower Body Dressing: Minimal assistance;Set up   Toilet Transfer: Min guard;Comfort height toilet   Toileting- Clothing  Manipulation and Hygiene: Minimal assistance       Functional mobility during ADLs: Min guard        Vision                     Perception     Praxis      Cognition   Behavior During Therapy: [facility] for tasks assessed/performed;Flat affect Overall Cognitive Status: Within Functional Limits for tasks assessed                       Extremity/Trunk Assessment               Exercises Total Joint Exercises Ankle Circles/Pumps: Strengthening;Both;15 reps;Seated Hip ABduction/ADduction: Strengthening;Both;15 reps;Seated Long Arc Quad: Strengthening;15 reps;Seated;Left Knee Flexion: Strengthening;Left;15 reps;Seated Marching in Standing: Strengthening;Both;15 reps;Seated   Shoulder Instructions       General Comments      Pertinent Vitals/ Pain       Pain Assessment: 0-10 Pain Score: 5  Pain Location: left hip Pain Descriptors / Indicators: Aching Pain Intervention(s): Limited activity within patient's tolerance;Monitored during session;Premedicated before session  Home Living                                          Prior Functioning/Environment  Frequency Min 1X/week     Progress Toward Goals  OT Goals(current goals can now be found in the care plan section)  Progress towards OT goals: Progressing toward goals  Acute Rehab OT Goals Patient Stated Goal: to be able to take care of herself OT Goal Formulation: With patient Time For Goal Achievement: 06/30/15 Potential to Achieve Goals: Good  Plan Discharge plan remains appropriate    Co-evaluation                 End of Session Equipment Utilized During Treatment: Gait belt;Rolling walker   Activity Tolerance Patient tolerated treatment well   Patient Left in chair;with call bell/phone within reach;with nursing/sitter in room (nurse in to give suppository and states she will apply chair alarm when done)   Nurse Communication Mobility status         Time: [id number] OT Time Calculation (min): 24 min  Charges: OT General Charges $OT Visit: 1 Procedure OT Treatments $Self Care/Home Management : 23-37 mins Amy T Lovett, OTR/L, CLT Lovett,Amy 06/17/2015, 10:57 AM

## 2015-06-17 NOTE — Clinical Social Work Placement (Signed)
   CLINICAL SOCIAL WORK PLACEMENT  NOTE  Date:  06/17/2015  Patient Details  Name: Lori Nguyen MRN: [id number] Date of Birth: [birth year]  Clinical Social Work is seeking post-discharge placement for this patient at the [place] level of care (*CSW will initial, date and re-position this form in  chart as items are completed):  Yes   Patient/family provided with [facility] Work Department's list of facilities offering this level of care within the geographic area requested by the patient (or if unable, by the patient's family).  Yes   Patient/family informed of their freedom to choose among providers that offer the needed level of care, that participate in Medicare, Medicaid or managed care program needed by the patient, have an available bed and are willing to accept the patient.  Yes   Patient/family informed of [facility]'s ownership interest in [facility] and [facility], as well as of the fact that they are under no obligation to receive care at these facilities.  PASRR submitted to EDS on       PASRR number received on       Existing PASRR number confirmed on 06/16/15     FL2 transmitted to all facilities in geographic area requested by pt/family on 06/16/15     FL2 transmitted to all facilities within larger geographic area on       Patient informed that his/her managed care company has contracts with or will negotiate with certain facilities, including the following:        Yes   Patient/family informed of bed offers received.  Patient chooses bed at  [facility] )     Physician recommends and patient chooses bed at      Patient to be transferred to  [facility] ) on 06/17/15.  Patient to be transferred to facility by  [facility] )     Patient family notified on 06/17/15 of transfer.  Name of family member notified:   (Husband Nicole Kindred aware of D/C today. )     PHYSICIAN       Additional Comment:     _______________________________________________ [name], LCSW 06/17/2015, 2:33 PM

## 2015-06-17 NOTE — Progress Notes (Signed)
Patient is medically stable for D/C to [facility] today. Per Sandy [facility]aam admissions coordinator patient is going to room E-13. RN will call report and arrange EMS for transport. Clinical Education officer, museum (CSW) sent D/C Summary and D/C packet to [employer] via [place]. Prescriptions and EMS form on chart. Patient is aware of above. CSW contacted patient's husband Nicole Kindred and made him aware of above. Please reconsult if future social work needs arise. CSW signing off.   Blima Rich, [place] [phone]

## 2015-06-17 NOTE — Progress Notes (Signed)
Spoke with Claiborne Billings, [facility] rep at [phone], to notify of non-emergent EMS transport.  Auth notification reference given as [id number].   Service date range good from 06/17/15 - 09/15/15.   Gap exception requested to determine if services can be considered at an in-network level.

## 2015-06-17 NOTE — Progress Notes (Signed)
Physical Therapy Treatment Patient Details Name: Lori Nguyen MRN: [id number] DOB: 09-20-1955 Today's Date: 06/17/2015    History of Present Illness Pt underwent L THR anterior approach without reported post-op complications. She is POD#0 at time of evaluation. Evaluation attempted earlier in PM however sensation had not fully returned. Pt reporting high levels of pain and nausea at time of evaluation.    PT Comments    Pt demonstrates excellent progress with therapy on this date. She still presents with L hip flexor weakness requiring assistance to perform seated hip flexion. Pt also with infrequent L hip buckling during gait and decreased stance time on LLE. Gait speed is considerably decreased and not functional for household mobility currently. She is able to progress ambulation distance but requires multiple standing rest breaks due to fatigue and pain. Pt will benefit from skilled PT services to address deficits in strength, balance, and mobility in order to return to full function at home.    Follow Up Recommendations  SNF     Equipment Recommendations  None recommended by PT    Recommendations for Other Services       Precautions / Restrictions Precautions Precautions: Anterior Hip;Fall Precaution Booklet Issued: Yes (comment) Restrictions Weight Bearing Restrictions: Yes LLE Weight Bearing: Weight bearing as tolerated    Mobility  Bed Mobility               General bed mobility comments: Received and left upright in recliner  Transfers Overall transfer level: Needs assistance Equipment used: Rolling walker (2 wheeled) Transfers: Sit to/from Stand Sit to Stand: Min guard         General transfer comment: Pt demonstrates increased time required to come to standing due to pain and LE weakness. Decreased weight shift to LLE. CGA required for safety due to intermittent L hip buckling  Ambulation/Gait Ambulation/Gait assistance: Min assist Ambulation  Distance (Feet): 110 Feet Assistive device: Rolling walker (2 wheeled) Gait Pattern/deviations: Step-to pattern;Decreased step length - right;Decreased stance time - left;Decreased weight shift to left Gait velocity: 10'=30.11 seconds Gait velocity interpretation: <1.8 ft/sec, indicative of risk for recurrent falls General Gait Details: Pt demonstrates significantly decreased gait speed during ambulation. She is able to gradually progress R step length with cues but still has step-to pattern. Pt provided cues to decreased WB through bilateral UEs. She requires multiple rest breaks due to fatigue and pain which both increase as distance progresses. Pt denies dizziness on this date. She demonstrates infrequent L hip buckling during ambulation   Stairs            Wheelchair Mobility    Modified Rankin (Stroke Patients Only)       Balance Overall balance assessment: Needs assistance   Sitting balance-Leahy Scale: Good       Standing balance-Leahy Scale: Fair                      Cognition Arousal/Alertness: Awake/alert Behavior During Therapy: WFL for tasks assessed/performed;Flat affect Overall Cognitive Status: Within Functional Limits for tasks assessed                      Exercises Total Joint Exercises Ankle Circles/Pumps: Strengthening;Both;15 reps;Seated Hip ABduction/ADduction: Strengthening;Both;15 reps;Seated Long Arc Quad: Strengthening;15 reps;Seated;Left Knee Flexion: Strengthening;Left;15 reps;Seated Marching in Standing: Strengthening;Both;15 reps;Seated    General Comments        Pertinent Vitals/Pain Pain Assessment: 0-10 Pain Score: 4  Pain Location: L hip, increases to 6/10 following ambulation Pain  Intervention(s): Monitored during session;Premedicated before session    Home Living                      Prior Function            PT Goals (current goals can now be found in the care plan section) Acute Rehab PT  Goals Patient Stated Goal: To move better PT Goal Formulation: With patient Time For Goal Achievement: 06/29/15 Potential to Achieve Goals: Good Progress towards PT goals: Progressing toward goals    Frequency  BID    PT Plan Current plan remains appropriate    Co-evaluation             End of Session Equipment Utilized During Treatment: Gait belt Activity Tolerance: Patient limited by fatigue;Patient limited by pain Patient left: in chair;with call bell/phone within reach (pt left with OT)     Time: [id number] PT Time Calculation (min) (ACUTE ONLY): 23 min  Charges:  $Gait Training: 8-22 mins $Therapeutic Exercise: 8-22 mins                    G Codes:      Lyndel Safe Darcia Lampi PT, DPT   Rhoderick Farrel 06/17/2015, 10:33 AM

## 2015-06-17 NOTE — Discharge Summary (Signed)
Physician Discharge Summary  Patient ID: Lori Nguyen MRN: [id number] DOB/AGE: 08-06-1955 59 y.o.  Admit date: 06/15/2015 Discharge date: 06/17/2015  Admission Diagnoses:  OSTEOARTHRITIS   Discharge Diagnoses: Patient Active Problem List   Diagnosis Date Noted  . Primary osteoarthritis of left hip 06/15/2015    Past Medical History  Diagnosis Date  . Hypertension   . Polycystic kidney   . Diabetes mellitus without complication ([place])     gestational diabetes in 20  . GERD (gastroesophageal reflux disease)   . Arthritis   . Anemia      Transfusion: none   Consultants (if any):    Discharged Condition: Improved  Hospital Course: ITATI PAGNOZZI is an 59 y.o. female who was admitted 06/15/2015 with a diagnosis of Left hip osteoarthritis and was brought to the operating room on 06/15/2015 and underwent the above named procedures.    Surgeries: Procedure(s): TOTAL HIP ARTHROPLASTY ANTERIOR APPROACH on 06/15/2015 Patient tolerated the surgery well. Taken to PACU where she was stabilized and then transferred to the orthopedic floor.  Started on Lovenox 40 q 12 hrs. Foot pumps applied bilaterally at 80 mm. Heels elevated on bed with rolled towels. No evidence of DVT. Negative Homan. Physical therapy started on day #1 for gait training and transfer. OT started day #1 for ADL and assisted devices.  Patient's foley was d/c on day #1. Patient's IV  was d/c on day #2.  On post op day #2 patient was stable and ready for discharge to rehab  Implants: Medacta AMIS 1 stem with 50 mm Mpact cup DM with liner and M 28 mm head  She was given perioperative antibiotics:  Anti-infectives    Start     Dose/Rate Route Frequency Ordered Stop   06/15/15 1200  ceFAZolin (ANCEF) IVPB 2 g/50 mL premix     2 g 100 mL/hr over 30 Minutes Intravenous Every 6 hours 06/15/15 1121 06/16/15 0041   06/15/15 0615  ceFAZolin (ANCEF) IVPB 2 g/50 mL premix  Status:  Discontinued     2 g 100 mL/hr over  30 Minutes Intravenous  Once 06/15/15 0603 06/15/15 1120   06/15/15 0551  ceFAZolin (ANCEF) 2-3 GM-% IVPB SOLR    Comments:  KENNEDY, ASHLEY: cabinet override      06/15/15 0551 06/15/15 0742    .  She was given sequential compression devices, early ambulation, and  Lovenox for DVT prophylaxis.  She benefited maximally from the hospital stay and there were no complications.    Recent vital signs:  Filed Vitals:   06/17/15 1326  BP: 99/50  Pulse: 89  Temp:   Resp:     Recent laboratory studies:  Lab Results  Component Value Date   HGB 10.7* 06/15/2015   HGB 12.2 06/03/2015   HGB 11.4* 05/01/2015   Lab Results  Component Value Date   WBC 11.2* 06/15/2015   PLT 164 06/15/2015   Lab Results  Component Value Date   INR 1.04 06/03/2015   Lab Results  Component Value Date   NA 142 06/03/2015   K 3.7 06/03/2015   CL 103 06/03/2015   CO2 30 06/03/2015   BUN 15 06/03/2015   CREATININE 0.75 06/15/2015   GLUCOSE 93 06/03/2015    Discharge Medications:     Medication List    STOP taking these medications        etodolac 400 MG tablet  Commonly known as:  LODINE      TAKE these medications  aspirin EC 81 MG tablet  Take 81 mg by mouth daily.     Biotin 10 MG Caps  Take 1 capsule by mouth daily.     cyclobenzaprine 10 MG tablet  Commonly known as:  FLEXERIL  Take 10 mg by mouth 3 (three) times daily as needed for muscle spasms.     enoxaparin 40 MG/0.4ML injection  Commonly known as:  LOVENOX  Inject 0.4 mLs (40 mg total) into the skin daily.     Green Tea 315 MG Caps  Take 2 capsules by mouth daily.     Iron 142 (45 FE) MG Tbcr  Take 1 tablet by mouth daily.     losartan-hydrochlorothiazide 100-25 MG tablet  Commonly known as:  HYZAAR  Take 1 tablet by mouth daily.     oxyCODONE 5 MG immediate release tablet  Commonly known as:  Oxy IR/ROXICODONE  Take 1-2 tablets (5-10 mg total) by mouth every 3 (three) hours as needed for breakthrough  pain.     pantoprazole 40 MG tablet  Commonly known as:  PROTONIX  Take 40 mg by mouth daily.     traMADol 50 MG tablet  Commonly known as:  ULTRAM  Take 50 mg by mouth 3 (three) times daily.     Vitamin D3 5000 UNITS Caps  Take 1 capsule by mouth daily.        Diagnostic Studies: Mm Digital Screening Bilateral  06/15/2015  CLINICAL DATA:  Screening. EXAM: DIGITAL SCREENING BILATERAL MAMMOGRAM WITH CAD COMPARISON:  Previous exam(s). ACR Breast Density Category c: The breast tissue is heterogeneously dense, which may obscure small masses. FINDINGS: There are no findings suspicious for malignancy. Images were processed with CAD. IMPRESSION: No mammographic evidence of malignancy. A result letter of this screening mammogram will be mailed directly to the patient. RECOMMENDATION: Screening mammogram in one year. (Code:SM-B-01Y) BI-RADS CATEGORY  1: Negative. Electronically Signed   By: Lajean Manes M.D.   On: 06/15/2015 09:17   Dg Hip Operative Unilat With Pelvis Left  06/15/2015  CLINICAL DATA:  LEFT hip replacement EXAM: OPERATIVE LEFT HIP (WITH PELVIS IF PERFORMED) 2 VIEWS TECHNIQUE: Fluoroscopic spot image(s) were submitted for interpretation post-operatively. COMPARISON:  None FLUOROSCOPY TIME:  Dose:  6.63  mGy Time:  0 minutes 33 seconds Images obtained: 2 FINDINGS: First image demonstrates osteoarthritic changes of LEFT hip joint and osseous demineralization. Second image demonstrates resection of the proximal LEFT femur and placement of a LEFT hip prosthesis. Distal aspect of the femoral component is not visualized. No fractures identified. IMPRESSION: LEFT hip replacement. Electronically Signed   By: Lavonia Dana M.D.   On: 06/15/2015 09:08   Dg Hip Unilat W Or W/o Pelvis 2-3 Views Left  06/15/2015  CLINICAL DATA:  Status post left hip replacement today. Initial encounter. EXAM: DG HIP (WITH OR WITHOUT PELVIS) 2-3V LEFT COMPARISON:  None. FINDINGS: Left hip replacement is in place.  The device is located and there is no fracture. Surgical staples are noted. IMPRESSION: Left hip replacement without evidence of complication. Electronically Signed   By: Inge Rise M.D.   On: 06/15/2015 10:16    Disposition:  Rehab facility        Follow-up Information    Follow up with [facility] .   Specialties:  [place], [place]   Contact information:   [address] [phone]      Follow up with MENZ,MICHAEL, MD In 2 weeks.  Specialty:  Orthopedic Surgery   Why:  For staple removal and skin check   Contact information:   [place] [address] [phone]        Signed: Feliberto Gottron 06/17/2015, 1:49 PM

## 2015-06-17 NOTE — Discharge Instructions (Signed)

## 2016-06-30 IMAGING — CR DG HIP (WITH OR WITHOUT PELVIS) 2-3V*L*
1 series · 2 of 2 positions shown · non-contrast
Comparison: None.

CLINICAL DATA: Status post left hip replacement today. Initial
encounter.

EXAM:
DG HIP (WITH OR WITHOUT PELVIS) 2-3V LEFT

[Series 1: ap · 0.17mm/px · 2 of 2 slices shown]
[im 1/2]
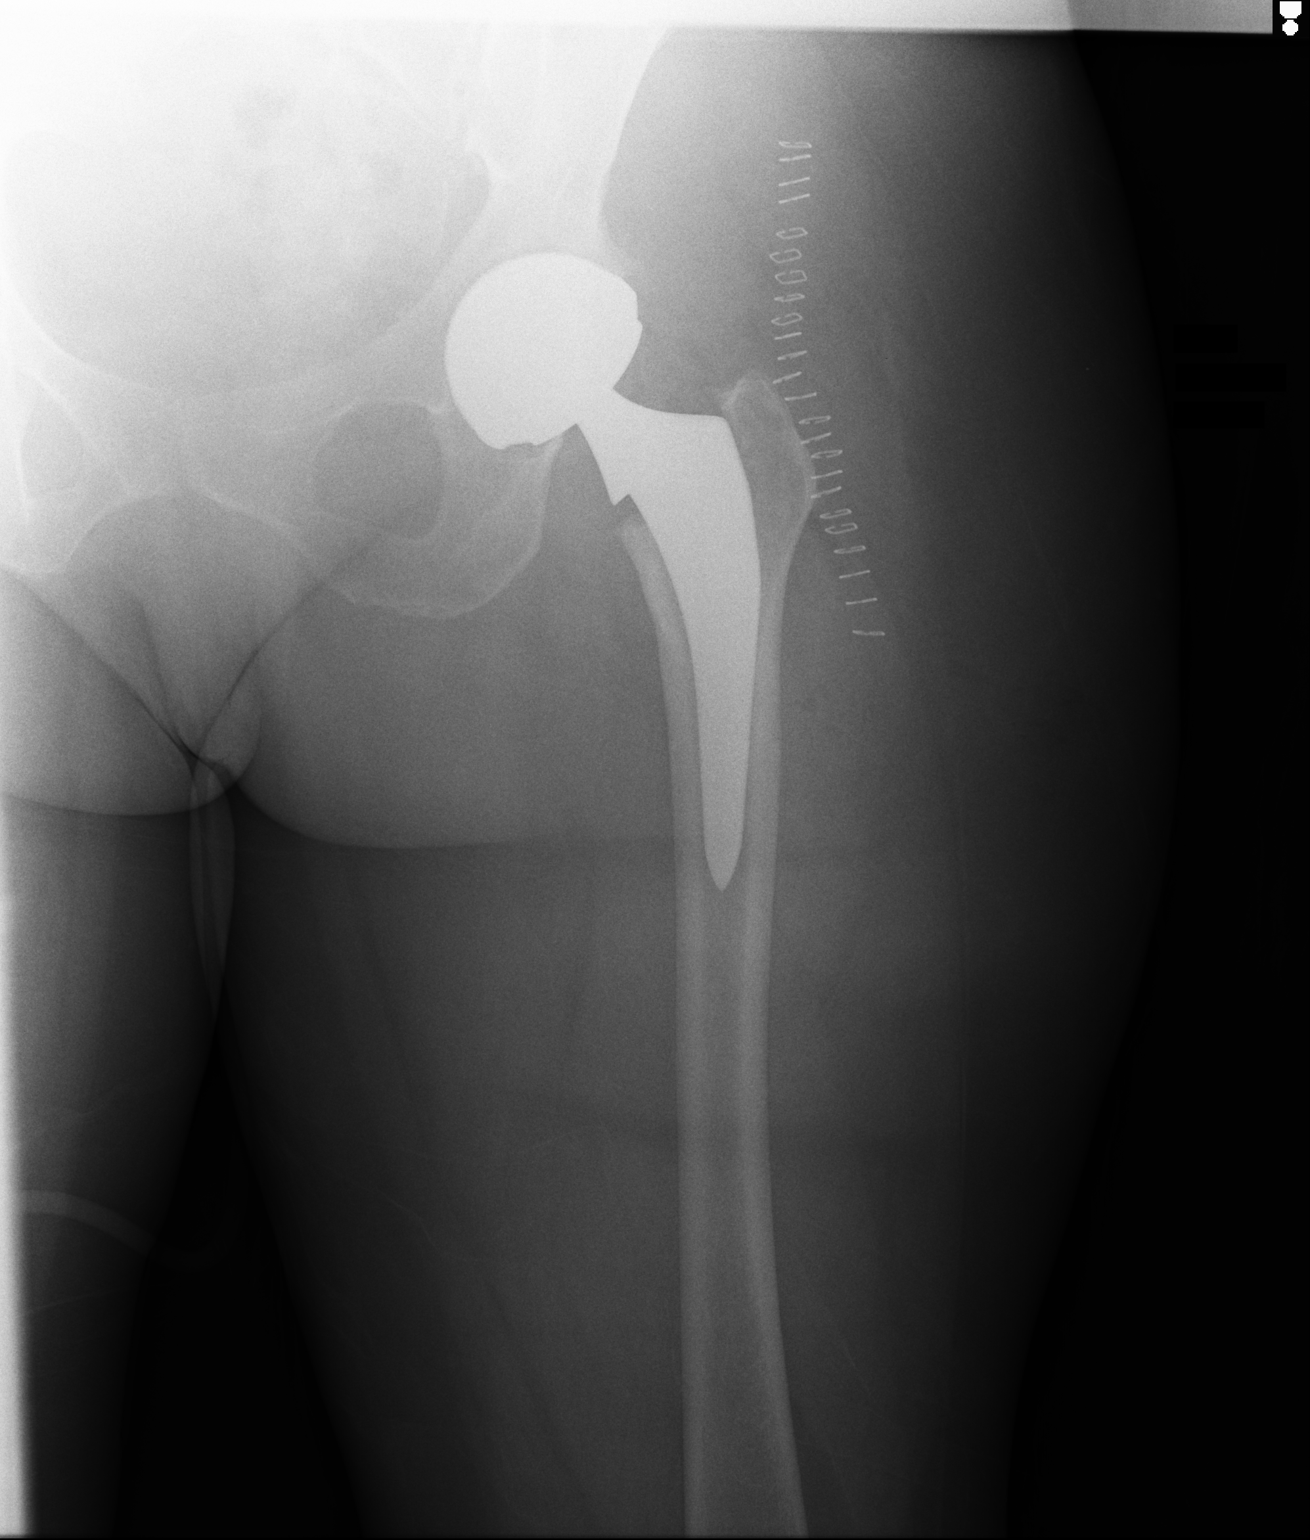
[im 2/2]
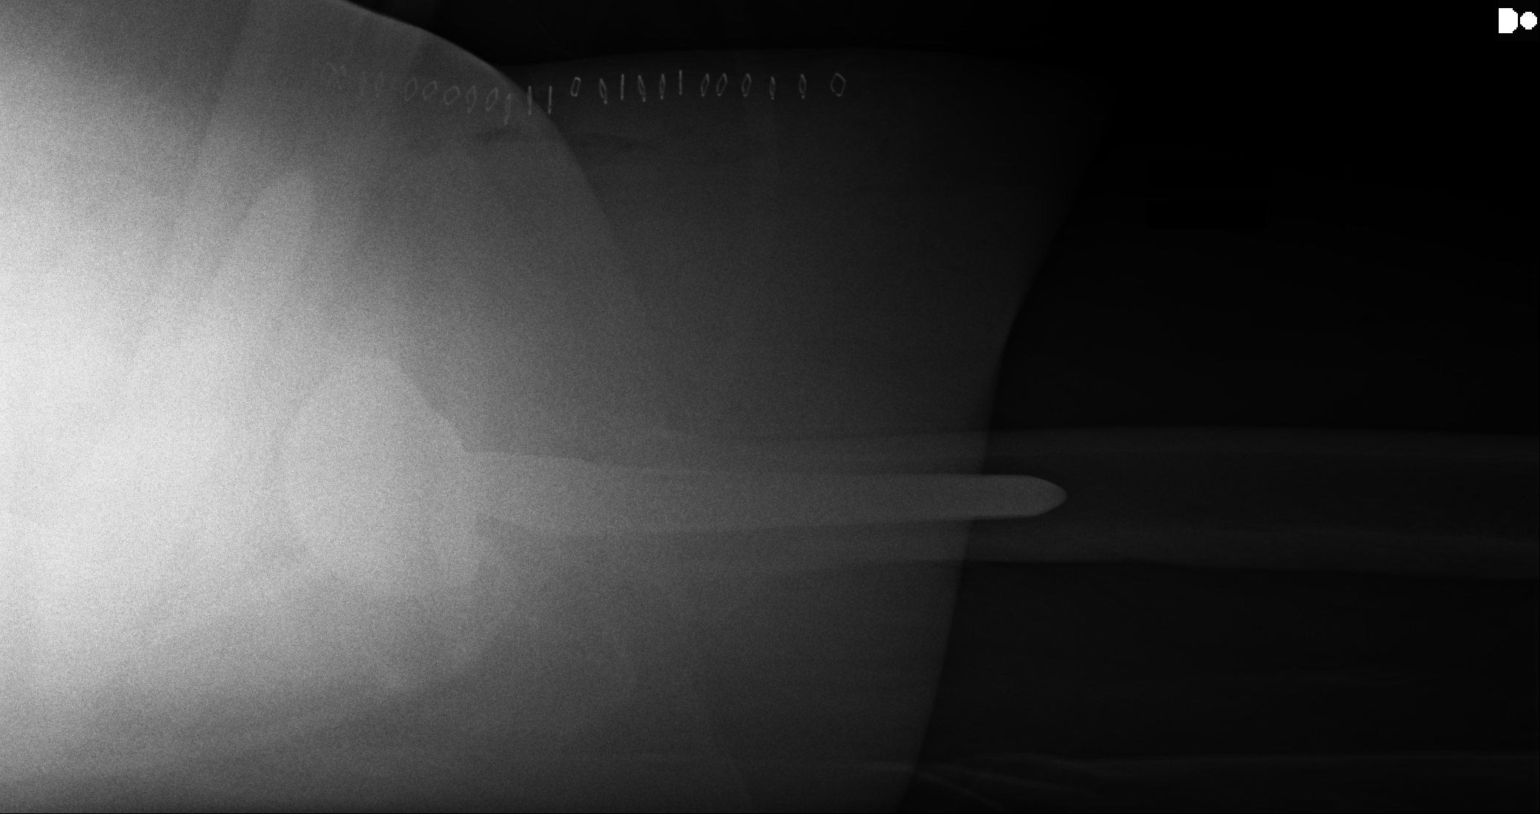

[2 of 2 positions shown; findings below may reference images not displayed]

FINDINGS: Left hip replacement is in place. The device is located and there is
no fracture. Surgical staples are noted.
IMPRESSION: Left hip replacement without evidence of complication.

## 2016-06-30 IMAGING — XA DG HIP (WITH PELVIS) OPERATIVE*L*
1 series · 1 of 1 positions shown · non-contrast
Comparison: None

CLINICAL DATA: LEFT hip replacement

EXAM:
OPERATIVE LEFT HIP (WITH PELVIS IF PERFORMED) 2 VIEWS
TECHNIQUE: Fluoroscopic spot image(s) were submitted for interpretation
post-operatively.

[Series 6001: 1 · 1 of 1 slices shown]
[im 1/1]
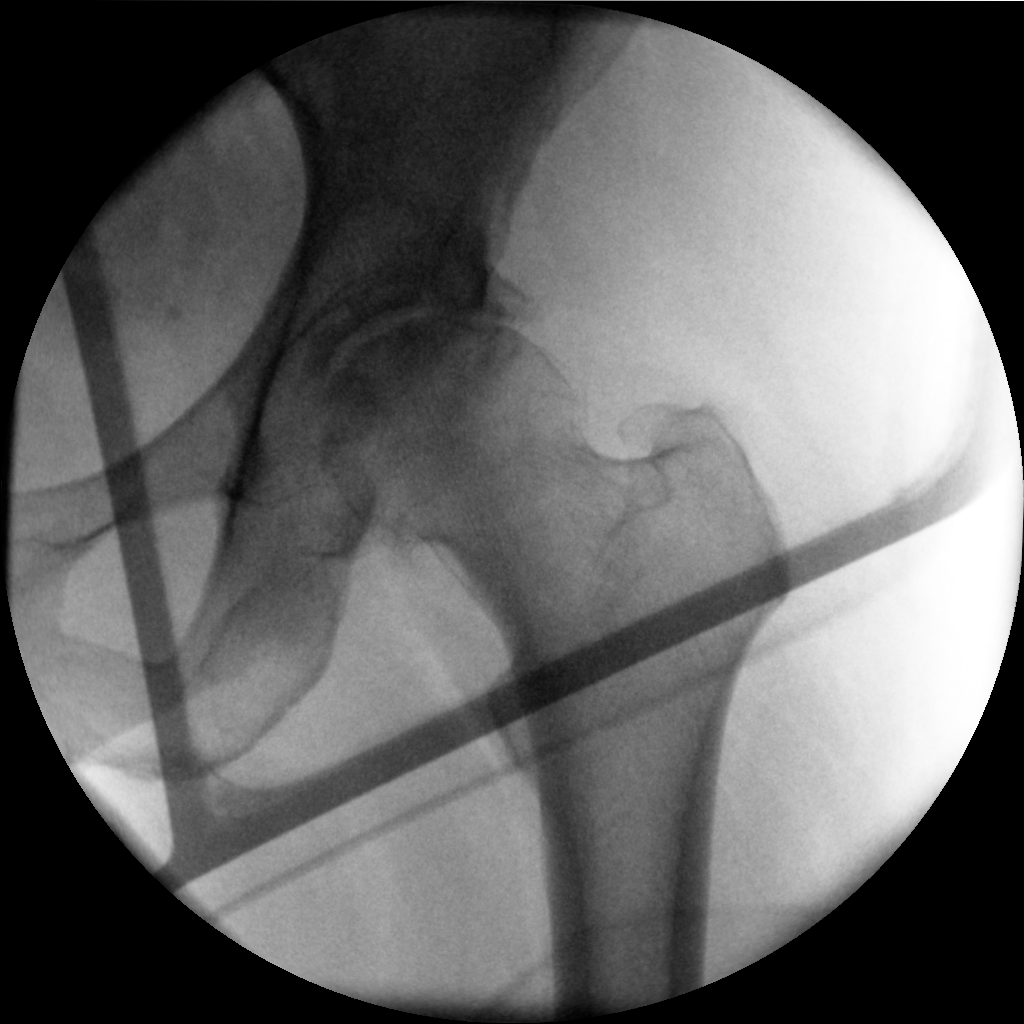

[1 of 1 positions shown; findings below may reference images not displayed]

FLUOROSCOPY TIME:  Dose:  6.63  mGy

Time:  0 minutes 33 seconds

Images obtained: 2
FINDINGS: First image demonstrates osteoarthritic changes of LEFT hip joint
and osseous demineralization.

Second image demonstrates resection of the proximal LEFT femur and
placement of a LEFT hip prosthesis.

Distal aspect of the femoral component is not visualized.

No fractures identified.
IMPRESSION: LEFT hip replacement.

## 2016-10-09 ENCOUNTER — Other Ambulatory Visit: Payer: Self-pay | Admitting: Orthopedic Surgery

## 2016-10-09 DIAGNOSIS — M25561 Pain in right knee: Secondary | ICD-10-CM

## 2016-10-09 DIAGNOSIS — M25562 Pain in left knee: Secondary | ICD-10-CM

## 2016-10-27 ENCOUNTER — Ambulatory Visit
Admission: RE | Admit: 2016-10-27 | Discharge: 2016-10-27 | Disposition: A | Discharge status: Home / Self Care | Payer: 59 | Source: Ambulatory Visit | Attending: Orthopedic Surgery | Admitting: Orthopedic Surgery

## 2016-10-27 DIAGNOSIS — M25562 Pain in left knee: Secondary | ICD-10-CM | POA: Insufficient documentation

## 2016-10-27 DIAGNOSIS — M65861 Other synovitis and tenosynovitis, right lower leg: Secondary | ICD-10-CM | POA: Diagnosis not present

## 2016-10-27 DIAGNOSIS — M25461 Effusion, right knee: Secondary | ICD-10-CM | POA: Diagnosis not present

## 2016-10-27 DIAGNOSIS — M25561 Pain in right knee: Secondary | ICD-10-CM | POA: Insufficient documentation

## 2016-10-27 DIAGNOSIS — S83272A Complex tear of lateral meniscus, current injury, left knee, initial encounter: Secondary | ICD-10-CM | POA: Diagnosis not present

## 2016-10-27 DIAGNOSIS — M25462 Effusion, left knee: Secondary | ICD-10-CM | POA: Diagnosis not present

## 2016-10-27 DIAGNOSIS — M65862 Other synovitis and tenosynovitis, left lower leg: Secondary | ICD-10-CM | POA: Diagnosis not present

## 2016-10-27 DIAGNOSIS — S83271A Complex tear of lateral meniscus, current injury, right knee, initial encounter: Secondary | ICD-10-CM | POA: Diagnosis not present

## 2017-03-27 ENCOUNTER — Ambulatory Visit: Payer: Self-pay | Admitting: Obstetrics and Gynecology

## 2017-03-27 ENCOUNTER — Encounter: Payer: Self-pay | Admitting: Obstetrics and Gynecology

## 2017-03-27 NOTE — Progress Notes (Deleted)
   PCP: System, Pcp Not In   No chief complaint on file.   HPI:      Ms. Lori Nguyen is a 61 y.o. No obstetric history on file. who LMP was No LMP recorded. Patient is postmenopausal., presents today for her annual examination.  Her menses are absent due to menopause.  She {does:18564} have intermenstrual bleeding.  She {does:18564} have vasomotor sx. She uses ***meds.  Sex activity: {sex active:315163}. She {does:18564} have vaginal dryness.  Last Pap: October 08, 2015  Results were: no abnormalities /neg HPV DNA.  Hx of STDs: none  Last mammogram: June 14, 2015  Results were: normal--routine follow-up in 12 months There is no FH of breast cancer. There is no FH of ovarian cancer. The patient {does:18564} do self-breast exams.  Colonoscopy: {hx:15363} . Repeat due after 10*** years.   Tobacco use: {tob:20664} Alcohol use: {Alcohol:11675} Exercise: {exercise:31265}  She {does:18564} get adequate calcium and Vitamin D in her diet.  Since her last annual GYN exam {date:304500300}, she has not had any significant changes in her health history.   Labs with PCP.     Past Medical History:  Diagnosis Date  . Anemia   . Arthritis   . Diabetes mellitus without complication ([place])    gestational diabetes in 67  . GERD (gastroesophageal reflux disease)   . Hypertension   . Polycystic kidney     Past Surgical History:  Procedure Laterality Date  . BREAST EXCISIONAL BIOPSY Left 2001   benign  . BREAST SURGERY Left    Breast Biopsy  . CESAREAN SECTION  1990  . HIP SURGERY     history of R hip repair  . JOINT REPLACEMENT Right January 2016   Hip Replacement, Dr. Rudene Christians  . TONSILLECTOMY    . TOTAL HIP ARTHROPLASTY Left 06/15/2015   Procedure: TOTAL HIP ARTHROPLASTY ANTERIOR APPROACH;  Surgeon: Hessie Knows, MD;  Location: [facility] ORS;  Service: Orthopedics;  Laterality: Left;  . TUBAL LIGATION      No family history on file.  Social History   Social History  .  Marital status: Married    Spouse name: N/A  . Number of children: N/A  . Years of education: N/A   Occupational History  . Not on file.   Social History Main Topics  . Smoking status: Never Smoker  . Smokeless tobacco: Never Used  . Alcohol use Yes     Comment: occ  . Drug use: No  . Sexual activity: Not on file   Other Topics Concern  . Not on file   Social History Narrative  . No narrative on file    No outpatient prescriptions have been marked as taking for the 03/27/17 encounter (Appointment) with Irean Kendricks, Deirdre Evener, PA-C.      ROS:  Review of Systems   Objective: There were no vitals taken for this visit.   OBGyn Exam  Results: No results found for this or any previous visit (from the past 24 hour(s)).  Assessment/Plan:  No diagnosis found.   No orders of the defined types were placed in this encounter.           GYN counsel {counseling:16159}    F/U  No Follow-up on file.  Zanaria Morell B. Nery Frappier, PA-C 03/27/2017 1:46 PM

## 2017-03-28 ENCOUNTER — Ambulatory Visit (INDEPENDENT_AMBULATORY_CARE_PROVIDER_SITE_OTHER): Payer: 59

## 2017-03-28 ENCOUNTER — Encounter: Payer: Self-pay | Admitting: Obstetrics and Gynecology

## 2017-03-28 ENCOUNTER — Ambulatory Visit (INDEPENDENT_AMBULATORY_CARE_PROVIDER_SITE_OTHER): Payer: 59 | Admitting: Obstetrics and Gynecology

## 2017-03-28 ENCOUNTER — Ambulatory Visit
Admission: RE | Admit: 2017-03-28 | Discharge: 2017-03-28 | Disposition: A | Discharge status: Home / Self Care | Payer: 59 | Source: Ambulatory Visit | Attending: Obstetrics and Gynecology | Admitting: Obstetrics and Gynecology

## 2017-03-28 VITALS — BP 128/76 | Ht 60.0 in | Wt 170.0 lb

## 2017-03-28 DIAGNOSIS — Z1211 Encounter for screening for malignant neoplasm of colon: Secondary | ICD-10-CM | POA: Diagnosis not present

## 2017-03-28 DIAGNOSIS — Z1231 Encounter for screening mammogram for malignant neoplasm of breast: Secondary | ICD-10-CM

## 2017-03-28 DIAGNOSIS — Z1239 Encounter for other screening for malignant neoplasm of breast: Secondary | ICD-10-CM

## 2017-03-28 DIAGNOSIS — Z01419 Encounter for gynecological examination (general) (routine) without abnormal findings: Secondary | ICD-10-CM | POA: Diagnosis not present

## 2017-03-28 DIAGNOSIS — N950 Postmenopausal bleeding: Secondary | ICD-10-CM

## 2017-03-28 LAB — HEMOCCULT GUIAC POC 1CARD (OFFICE): Fecal Occult Blood, POC: NEGATIVE

## 2017-03-28 NOTE — Progress Notes (Signed)
PCP: System, Pcp Not In   Chief Complaint  Patient presents with  . Annual Exam    HPI:      Ms. Lori Nguyen is a 61 y.o. No obstetric history on file. who LMP was No LMP recorded. Patient is postmenopausal., presents today for her annual examination.  Her menses are absent due to menopause. She continues to have occasional light bleeding with wiping only, every 3-4 months. Sx have been going on for several yrs. Pt had GYN u/s last yr with EM=3.75 mm. Sx have persisted so pt should have EMB.   She does not have vasomotor sx.   Sex activity: single partner, contraception - post menopausal status. She does not have vaginal dryness. No postcoital bleeding.  Last Pap: October 08, 2015  Results were: no abnormalities /neg HPV DNA.  Hx of STDs: none  Last mammogram: June 14, 2015  Results were: normal--routine follow-up in 12 months There is no FH of breast cancer. There is no FH of ovarian cancer. The patient does do self-breast exams.  Colonoscopy: Never. Has declined in past, including last yr  Tobacco use: The patient denies current or previous tobacco use. Alcohol use: none Exercise: moderately active  She does get adequate calcium and Vitamin D in her diet.  Since her last annual GYN exam, she has not had any significant changes in her health history.   Labs with PCP.   Past Medical History:  Diagnosis Date  . Anemia   . Arthritis   . Diabetes mellitus without complication ([place])    gestational diabetes in 32  . GERD (gastroesophageal reflux disease)   . Hypertension   . Polycystic kidney     Past Surgical History:  Procedure Laterality Date  . BREAST EXCISIONAL BIOPSY Left 2001   benign  . BREAST SURGERY Left    Breast Biopsy  . CESAREAN SECTION  1990  . HIP SURGERY     history of R hip repair  . JOINT REPLACEMENT Right January 2016   Hip Replacement, Dr. Rudene Christians  . TONSILLECTOMY    . TOTAL HIP ARTHROPLASTY Left 06/15/2015   Procedure: TOTAL HIP  ARTHROPLASTY ANTERIOR APPROACH;  Surgeon: Hessie Knows, MD;  Location: [facility] ORS;  Service: Orthopedics;  Laterality: Left;  . TUBAL LIGATION      Family History  Problem Relation Age of Onset  . Hypertension Mother   . Lung cancer Father   . Lung cancer Sister     Social History   Social History  . Marital status: Married    Spouse name: N/A  . Number of children: N/A  . Years of education: N/A   Occupational History  . Not on file.   Social History Main Topics  . Smoking status: Never Smoker  . Smokeless tobacco: Never Used  . Alcohol use Yes     Comment: occ  . Drug use: No  . Sexual activity: Not Currently    Birth control/ protection: Post-menopausal   Other Topics Concern  . Not on file   Social History Narrative  . No narrative on file    Current Meds  Medication Sig  . aspirin EC 81 MG tablet Take 81 mg by mouth daily.  . Biotin 10 MG CAPS Take 1 capsule by mouth daily.  . Cholecalciferol (VITAMIN D3) 5000 UNITS CAPS Take 1 capsule by mouth daily.  . cyclobenzaprine (FLEXERIL) 10 MG tablet Take 10 mg by mouth 3 (three) times daily as needed for muscle spasms.  Marland Kitchen  Ferrous Sulfate (IRON) 142 (45 FE) MG TBCR Take 1 tablet by mouth daily.  Marland Kitchen losartan-hydrochlorothiazide (HYZAAR) 100-25 MG tablet Take 1 tablet by mouth daily.  Marland Kitchen oxyCODONE (OXY IR/ROXICODONE) 5 MG immediate release tablet Take 1-2 tablets (5-10 mg total) by mouth every 3 (three) hours as needed for breakthrough pain.  . pantoprazole (PROTONIX) 40 MG tablet Take 40 mg by mouth daily.  . traMADol (ULTRAM) 50 MG tablet Take 50 mg by mouth 3 (three) times daily.      ROS:  Review of Systems  Constitutional: Negative for fatigue, fever and unexpected weight change.  Respiratory: Negative for cough, shortness of breath and wheezing.   Cardiovascular: Negative for chest pain, palpitations and leg swelling.  Gastrointestinal: Negative for blood in stool, constipation, diarrhea, nausea and vomiting.    Endocrine: Negative for cold intolerance, heat intolerance and polyuria.  Genitourinary: Positive for vaginal bleeding. Negative for dyspareunia, dysuria, flank pain, frequency, genital sores, hematuria, menstrual problem, pelvic pain, urgency, vaginal discharge and vaginal pain.  Musculoskeletal: Positive for arthralgias and joint swelling. Negative for back pain and myalgias.  Skin: Positive for rash.  Neurological: Negative for dizziness, syncope, light-headedness, numbness and headaches.  Hematological: Negative for adenopathy.  Psychiatric/Behavioral: Negative for agitation, confusion, sleep disturbance and suicidal ideas. The patient is not nervous/anxious.      Objective: BP 128/76   Ht 5' (1.524 m)   Wt 170 lb (77.1 kg)   BMI 33.20 kg/m    Physical Exam  Constitutional: She is oriented to person, place, and time. She appears well-developed and well-nourished.  Genitourinary: Vagina normal and uterus normal. There is no rash or tenderness on the right labia. There is no rash or tenderness on the left labia. No erythema or tenderness in the vagina. No vaginal discharge found. Right adnexum does not display mass and does not display tenderness. Left adnexum does not display mass and does not display tenderness. Cervix does not exhibit motion tenderness or polyp. Uterus is not enlarged or tender. Rectal exam shows no fissure, no tenderness and guaiac negative stool.  Neck: Normal range of motion. No thyromegaly present.  Cardiovascular: Normal rate, regular rhythm and normal heart sounds.   No murmur heard. Pulmonary/Chest: Effort normal and breath sounds normal. Right breast exhibits no mass, no nipple discharge, no skin change and no tenderness. Left breast exhibits no mass, no nipple discharge, no skin change and no tenderness.  Abdominal: Soft. There is no tenderness. There is no guarding.  Musculoskeletal: Normal range of motion.  Neurological: She is alert and oriented to  person, place, and time. No cranial nerve deficit.  Psychiatric: She has a normal mood and affect. Her behavior is normal.  Vitals reviewed.   Results: Results for orders placed or performed in visit on 03/28/17 (from the past 24 hour(s))  POCT Occult Blood Stool     Status: Normal   Collection Time: 03/28/17 10:47 AM  Result Value Ref Range   Fecal Occult Blood, POC Negative Negative   Card #1 Date     Card #2 Fecal Occult Blod, POC     Card #2 Date     Card #3 Fecal Occult Blood, POC     Card #3 Date      Assessment/Plan:  Encounter for annual routine gynecological examination  Screening for breast cancer - Pt to sched mammo. - Plan: MM DIGITAL SCREENING BILATERAL  Screening for colon cancer - Neg FOBT. Refer to GI for scr colonoscopy.  - Plan: POCT Occult  Blood Stool, Ambulatory referral to Gastroenterology  Postmenopausal bleeding - EMB attempted but unsuccessful due to cx os. Chck GYN u/s. Will f/u with resutls.  - Plan: US Transvaginal Non-OB         GYN counsel mammography screening, menopause, adequate intake of calcium and vitamin D, diet and exercise    F/U  Return in about 1 day (around 03/29/2017) for GYN u/s for PMB--[facility] to call pt.  Makayleigh Poliquin B. Khy Pitre, PA-C 03/28/2017 10:50 AM

## 2017-03-29 ENCOUNTER — Telehealth: Payer: Self-pay | Admitting: Obstetrics and Gynecology

## 2017-03-29 NOTE — Telephone Encounter (Signed)
Pt aware of GYN u/s results with EM=6.82 mm; cystic changes in EM, suggestive of polyp, and possible blood in EM. EMB attempted in office but cx stenotic. Discussed with RPH. Suggested hysteroscopy/D&C. Pt sched appt for conf re: procedure.

## 2017-03-30 ENCOUNTER — Encounter: Payer: Self-pay | Admitting: Obstetrics and Gynecology

## 2017-04-03 ENCOUNTER — Ambulatory Visit (INDEPENDENT_AMBULATORY_CARE_PROVIDER_SITE_OTHER): Payer: 59 | Admitting: Obstetrics & Gynecology

## 2017-04-03 ENCOUNTER — Encounter: Payer: Self-pay | Admitting: Obstetrics & Gynecology

## 2017-04-03 VITALS — BP 120/70 | HR 75 | Ht 60.0 in | Wt 169.0 lb

## 2017-04-03 DIAGNOSIS — R938 Abnormal findings on diagnostic imaging of other specified body structures: Secondary | ICD-10-CM

## 2017-04-03 DIAGNOSIS — N950 Postmenopausal bleeding: Secondary | ICD-10-CM

## 2017-04-03 DIAGNOSIS — R9389 Abnormal findings on diagnostic imaging of other specified body structures: Secondary | ICD-10-CM

## 2017-04-03 NOTE — Patient Instructions (Signed)
Hysteroscopy  Hysteroscopy is a procedure used for looking inside the womb (uterus). It may be done for various reasons, including:  · To evaluate abnormal bleeding, fibroid (benign, noncancerous) tumors, polyps, scar tissue (adhesions), and possibly cancer of the uterus.  · To look for lumps (tumors) and other uterine growths.  · To look for causes of why a woman cannot get pregnant (infertility), causes of recurrent loss of pregnancy (miscarriages), or a lost intrauterine device (IUD).  · To perform a sterilization by blocking the fallopian tubes from inside the uterus.    In this procedure, a thin, flexible tube with a tiny light and camera on the end of it (hysteroscope) is used to look inside the uterus. A hysteroscopy should be done right after a menstrual period to be sure you are not pregnant.  LET YOUR HEALTH CARE PROVIDER KNOW ABOUT:  · Any allergies you have.  · All medicines you are taking, including vitamins, herbs, eye drops, creams, and over-the-counter medicines.  · Previous problems you or members of your family have had with the use of anesthetics.  · Any blood disorders you have.  · Previous surgeries you have had.  · Medical conditions you have.  RISKS AND COMPLICATIONS  Generally, this is a safe procedure. However, as with any procedure, complications can occur. Possible complications include:  · Putting a hole in the uterus.  · Excessive bleeding.  · Infection.  · Damage to the cervix.  · Injury to other organs.  · Allergic reaction to medicines.  · Too much fluid used in the uterus for the procedure.    BEFORE THE PROCEDURE  · Ask your health care provider about changing or stopping any regular medicines.  · Do not take aspirin or blood thinners for 1 week before the procedure, or as directed by your health care provider. These can cause bleeding.  · If you smoke, do not smoke for 2 weeks before the procedure.  · In some cases, a medicine is placed in the cervix the day before the procedure.  This medicine makes the cervix have a larger opening (dilate). This makes it easier for the instrument to be inserted into the uterus during the procedure.  · Do not eat or drink anything for at least 8 hours before the surgery.  · Arrange for someone to take you home after the procedure.  PROCEDURE  · You may be given a medicine to relax you (sedative). You may also be given one of the following:  ? A medicine that numbs the area around the cervix (local anesthetic).  ? A medicine that makes you sleep through the procedure (general anesthetic).  · The hysteroscope is inserted through the vagina into the uterus. The camera on the hysteroscope sends a picture to a TV screen. This gives the surgeon a good view inside the uterus.  · During the procedure, air or a liquid is put into the uterus, which allows the surgeon to see better.  · Sometimes, tissue is gently scraped from inside the uterus. These tissue samples are sent to a lab for testing.  What to expect after the procedure  · If you had a general anesthetic, you may be groggy for a couple hours after the procedure.  · If you had a local anesthetic, you will be able to go home as soon as you are stable and feel ready.  · You may have some cramping. This normally lasts for a couple days.  · You may   have bleeding, which varies from light spotting for a few days to menstrual-like bleeding for 3-7 days. This is normal.  · If your test results are not back during the visit, make an appointment with your health care provider to find out the results.  This information is not intended to replace advice given to you by your health care provider. Make sure you discuss any questions you have with your health care provider.  Document Released: 10/23/2000 Document Revised: 12/23/2015 Document Reviewed: 02/13/2013  Elsevier Interactive Patient Education © 2017 Elsevier Inc[employer]

## 2017-04-03 NOTE — Progress Notes (Signed)
  NWG:[id number] bleeding is off and on over the last year.  Not heavy.  [place] w some endometrial thickening.  Unable to do office EMB. No other sx's of concern.  PMHx: She  has a past medical history of Anemia; Arthritis; Diabetes mellitus without complication ([place]); GERD (gastroesophageal reflux disease); Hypertension; and Polycystic kidney. Also,  has a past surgical history that includes Hip surgery; Joint replacement (Right, January 2016); Tonsillectomy; Breast surgery (Left); Tubal ligation; Cesarean section (1990); Total hip arthroplasty (Left, 06/15/2015); and Breast excisional biopsy (Left, 2001)., family history includes Hypertension in her mother; Lung cancer in her father and sister.,  reports that she has never smoked. She has never used smokeless tobacco. She reports that she drinks alcohol. She reports that she does not use drugs.  She has a current medication list which includes the following prescription(s): aspirin ec, biotin, vitamin d3, cyclobenzaprine, enoxaparin, iron, green tea, losartan-hydrochlorothiazide, oxycodone, pantoprazole, and tramadol. Also, is allergic to latex.  Review of Systems  All other systems reviewed and are negative.   Objective: BP 120/70   Pulse 75   Ht 5' (1.524 m)   Wt 169 lb (76.7 kg)   BMI 33.01 kg/m   Physical examination Constitutional NAD, Conversant  Skin No rashes, lesions or ulceration.   Extremities: Moves all appropriately.  Normal ROM for age. No lymphadenopathy.  Neuro: Grossly intact  Psych: Oriented to PPT.  Normal mood. Normal affect.   Assessment:  Post-menopausal bleeding  Endometrial thickening on ultrasound Plan  Hyst D&C, pt to check schedule Risks of cancer low but needs to be confirmed (unable to do office EMB). Polypectomy may also be therapeutic as well as diagnostic  A total of 15 minutes were spent face-to-face with the patient during this encounter and over half of that time dealt with counseling and  coordination of care.  Barnett Applebaum, MD, [facility] 04/03/2017  4:59 PM

## 2017-04-04 ENCOUNTER — Telehealth: Payer: Self-pay | Admitting: Obstetrics & Gynecology

## 2017-04-04 NOTE — Telephone Encounter (Signed)
-----   Message from Gae Dry, MD sent at 04/03/2017  4:57 PM EDT ----- Regarding: surgery Surgery Booking Request Patient Full Name:   MRN: [id number]  DOB: 1955-10-11  Surgeon: [name], MD  Requested Surgery Date and Time: Anytime she wants, plz call and see as she checks her schedule Primary Diagnosis AND Code: Postmenopausal Bleeding. ENdometrial Thickening. Secondary Diagnosis and Code:  Surgical Procedure: Hysteroscopy D&C L&D Notification: No Admission Status: same day surgery Length of Surgery: 20 Special Case Needs: Myosure H&P: yes (date) Phone Interview???: yes Interpreter: Language:  Medical Clearance: no Special Scheduling Instructions: no

## 2017-04-04 NOTE — Telephone Encounter (Signed)
Lmtrc

## 2017-04-06 NOTE — Telephone Encounter (Signed)
Pt is returning call. Please advise  °

## 2017-04-12 ENCOUNTER — Other Ambulatory Visit: Payer: Self-pay | Admitting: Obstetrics & Gynecology

## 2017-04-12 NOTE — Telephone Encounter (Signed)
PreOp orders updated in Epic with ABX ordered.  Let preop know.

## 2017-04-12 NOTE — Telephone Encounter (Signed)
Heather at [facility] is aware.

## 2017-04-12 NOTE — Telephone Encounter (Signed)
Patient is aware of of H&P on 05/09/17 @ 4:10pm at [facility] w/ Dr. Kenton Kingfisher, Pre-admit Testing phone interview afterwards, and OR on 05/17/17. Patient is aware to expect phone calls from the [facility]. Patient wanted to make [place] aware that she usually takes an antibiotic before going to the dentist, and wonders if she will need to do the same before the surgery. Patient will mention this to Dr. Kenton Kingfisher at her H&P, as well as the Pharmacist, and Heather from [place]. Ext given.

## 2017-04-13 ENCOUNTER — Telehealth: Payer: Self-pay

## 2017-04-13 ENCOUNTER — Other Ambulatory Visit: Payer: Self-pay

## 2017-04-13 DIAGNOSIS — Z1211 Encounter for screening for malignant neoplasm of colon: Secondary | ICD-10-CM

## 2017-04-13 DIAGNOSIS — Z1212 Encounter for screening for malignant neoplasm of rectum: Principal | ICD-10-CM

## 2017-04-13 NOTE — Telephone Encounter (Signed)
Gastroenterology Pre-Procedure Review  Request Date: 10/4 Requesting Physician: Dr. Vicente Males  PATIENT REVIEW QUESTIONS: The patient responded to the following health history questions as indicated:    1. Are you having any GI issues? no 2. Do you have a personal history of Polyps? no 3. Do you have a family history of Colon Cancer or Polyps? no 4. Diabetes Mellitus? no 5. Joint replacements in the past 12 months?no 6. Major health problems in the past 3 months?no 7. Any artificial heart valves, MVP, or defibrillator?no    MEDICATIONS & ALLERGIES:    Patient reports the following regarding taking any anticoagulation/antiplatelet therapy:   Plavix, Coumadin, Eliquis, Xarelto, Lovenox, Pradaxa, Brilinta, or Effient? no Aspirin? yes (81mg )  Patient confirms/reports the following medications:  Current Outpatient Prescriptions  Medication Sig Dispense Refill  . aspirin EC 81 MG tablet Take 81 mg by mouth daily.    . Biotin 10 MG CAPS Take 1 capsule by mouth daily.    . Cholecalciferol (VITAMIN D3) 5000 UNITS CAPS Take 1 capsule by mouth daily.    . cyclobenzaprine (FLEXERIL) 10 MG tablet Take 10 mg by mouth 3 (three) times daily as needed for muscle spasms.    Marland Kitchen enoxaparin (LOVENOX) 40 MG/0.4ML injection Inject 0.4 mLs (40 mg total) into the skin daily. (Patient not taking: Reported on 03/28/2017) 14 Syringe 0  . Ferrous Sulfate (IRON) 142 (45 FE) MG TBCR Take 1 tablet by mouth daily.    Nyoka Cowden Tea 315 MG CAPS Take 2 capsules by mouth daily.    Marland Kitchen losartan-hydrochlorothiazide (HYZAAR) 100-25 MG tablet Take 1 tablet by mouth daily.    Marland Kitchen oxyCODONE (OXY IR/ROXICODONE) 5 MG immediate release tablet Take 1-2 tablets (5-10 mg total) by mouth every 3 (three) hours as needed for breakthrough pain. 30 tablet 0  . pantoprazole (PROTONIX) 40 MG tablet Take 40 mg by mouth daily.    . traMADol (ULTRAM) 50 MG tablet Take 50 mg by mouth 3 (three) times daily.     No current facility-administered medications  for this visit.     Patient confirms/reports the following allergies:  Allergies  Allergen Reactions  . Latex Swelling    No orders of the defined types were placed in this encounter.   AUTHORIZATION INFORMATION Primary Insurance: 1D#: Group #:  Secondary Insurance: 1D#: Group #:  SCHEDULE INFORMATION: Date: 10/4 Time: Location: [place]

## 2017-05-02 MED ORDER — CEFOXITIN SODIUM-DEXTROSE 2-2.2 GM-% IV SOLR (PREMIX): 2.0000 g | INTRAVENOUS | Status: DC

## 2017-05-03 ENCOUNTER — Ambulatory Visit: Payer: 59 | Admitting: Anesthesiology

## 2017-05-03 ENCOUNTER — Encounter
Admission: RE | Disposition: A | Discharge status: Home / Self Care | Payer: Self-pay | Source: Ambulatory Visit | Attending: Gastroenterology

## 2017-05-03 ENCOUNTER — Ambulatory Visit
Admission: RE | Admit: 2017-05-03 | Discharge: 2017-05-03 | Disposition: A | Discharge status: Home / Self Care | Payer: 59 | Source: Ambulatory Visit | Attending: Gastroenterology | Admitting: Gastroenterology

## 2017-05-03 DIAGNOSIS — M1990 Unspecified osteoarthritis, unspecified site: Secondary | ICD-10-CM | POA: Insufficient documentation

## 2017-05-03 DIAGNOSIS — Z1211 Encounter for screening for malignant neoplasm of colon: Secondary | ICD-10-CM | POA: Diagnosis present

## 2017-05-03 DIAGNOSIS — K635 Polyp of colon: Secondary | ICD-10-CM | POA: Insufficient documentation

## 2017-05-03 DIAGNOSIS — Z91040 Latex allergy status: Secondary | ICD-10-CM | POA: Diagnosis not present

## 2017-05-03 DIAGNOSIS — K5730 Diverticulosis of large intestine without perforation or abscess without bleeding: Secondary | ICD-10-CM | POA: Insufficient documentation

## 2017-05-03 DIAGNOSIS — Z1212 Encounter for screening for malignant neoplasm of rectum: Secondary | ICD-10-CM | POA: Diagnosis not present

## 2017-05-03 DIAGNOSIS — Z7982 Long term (current) use of aspirin: Secondary | ICD-10-CM | POA: Diagnosis not present

## 2017-05-03 DIAGNOSIS — K219 Gastro-esophageal reflux disease without esophagitis: Secondary | ICD-10-CM | POA: Diagnosis not present

## 2017-05-03 DIAGNOSIS — I10 Essential (primary) hypertension: Secondary | ICD-10-CM | POA: Diagnosis not present

## 2017-05-03 DIAGNOSIS — E669 Obesity, unspecified: Secondary | ICD-10-CM | POA: Diagnosis not present

## 2017-05-03 DIAGNOSIS — D123 Benign neoplasm of transverse colon: Secondary | ICD-10-CM

## 2017-05-03 DIAGNOSIS — D120 Benign neoplasm of cecum: Secondary | ICD-10-CM | POA: Diagnosis not present

## 2017-05-03 DIAGNOSIS — Z96643 Presence of artificial hip joint, bilateral: Secondary | ICD-10-CM | POA: Diagnosis not present

## 2017-05-03 DIAGNOSIS — Z79899 Other long term (current) drug therapy: Secondary | ICD-10-CM | POA: Diagnosis not present

## 2017-05-03 DIAGNOSIS — Q613 Polycystic kidney, unspecified: Secondary | ICD-10-CM | POA: Diagnosis not present

## 2017-05-03 DIAGNOSIS — Z6831 Body mass index (BMI) 31.0-31.9, adult: Secondary | ICD-10-CM | POA: Diagnosis not present

## 2017-05-03 DIAGNOSIS — Z7901 Long term (current) use of anticoagulants: Secondary | ICD-10-CM | POA: Insufficient documentation

## 2017-05-03 DIAGNOSIS — D125 Benign neoplasm of sigmoid colon: Secondary | ICD-10-CM | POA: Diagnosis not present

## 2017-05-03 HISTORY — DX: Prediabetes: R73.03

## 2017-05-03 HISTORY — DX: Abnormal uterine and vaginal bleeding, unspecified: N93.9

## 2017-05-03 HISTORY — PX: COLONOSCOPY WITH PROPOFOL: SHX5780

## 2017-05-03 SURGERY — COLONOSCOPY WITH PROPOFOL
Anesthesia: General

## 2017-05-03 MED ORDER — EPHEDRINE SULFATE 50 MG/ML IJ SOLN
INTRAMUSCULAR | Status: DC | PRN
  Administered 2017-05-03 (×2): 10 mg via INTRAVENOUS

## 2017-05-03 MED ORDER — PHENYLEPHRINE HCL 10 MG/ML IJ SOLN
INTRAMUSCULAR | Status: AC
  Filled 2017-05-03: qty 1

## 2017-05-03 MED ORDER — PHENYLEPHRINE HCL 10 MG/ML IJ SOLN
INTRAMUSCULAR | Status: DC | PRN
  Administered 2017-05-03 (×4): 50 ug via INTRAVENOUS

## 2017-05-03 MED ORDER — EPHEDRINE SULFATE 50 MG/ML IJ SOLN
INTRAMUSCULAR | Status: AC
  Filled 2017-05-03: qty 1

## 2017-05-03 MED ORDER — PROPOFOL 500 MG/50ML IV EMUL
INTRAVENOUS | Status: DC | PRN
  Administered 2017-05-03: 120 ug/kg/min via INTRAVENOUS

## 2017-05-03 MED ORDER — FENTANYL CITRATE (PF) 100 MCG/2ML IJ SOLN
INTRAMUSCULAR | Status: AC
  Filled 2017-05-03: qty 2

## 2017-05-03 MED ORDER — MIDAZOLAM HCL 2 MG/2ML IJ SOLN
INTRAMUSCULAR | Status: AC
  Filled 2017-05-03: qty 2

## 2017-05-03 MED ORDER — SODIUM CHLORIDE 0.9 % IV SOLN
INTRAVENOUS | Status: DC
  Administered 2017-05-03: 09:00:00 via INTRAVENOUS

## 2017-05-03 MED ORDER — MIDAZOLAM HCL 2 MG/2ML IJ SOLN
INTRAMUSCULAR | Status: DC | PRN
  Administered 2017-05-03: 2 mg via INTRAVENOUS

## 2017-05-03 MED ORDER — FENTANYL CITRATE (PF) 100 MCG/2ML IJ SOLN
INTRAMUSCULAR | Status: DC | PRN
  Administered 2017-05-03: 50 ug via INTRAVENOUS

## 2017-05-03 MED ORDER — PROPOFOL 500 MG/50ML IV EMUL
INTRAVENOUS | Status: AC
  Filled 2017-05-03: qty 50

## 2017-05-03 NOTE — Anesthesia Procedure Notes (Signed)
Performed by: COOK-MARTIN, Melton Walls Pre-anesthesia Checklist: Patient identified, Emergency Drugs available, Suction available, Patient being monitored and Timeout performed Patient Re-evaluated:Patient Re-evaluated prior to induction Oxygen Delivery Method: Nasal cannula Preoxygenation: Pre-oxygenation with 100% oxygen Induction Type: IV induction Placement Confirmation: positive ETCO2 and CO2 detector       

## 2017-05-03 NOTE — Anesthesia Postprocedure Evaluation (Signed)
Anesthesia Post Note  Patient: Lori Nguyen  Procedure(s) Performed: COLONOSCOPY WITH PROPOFOL (N/A )  Patient location during evaluation: Endoscopy Anesthesia Type: General Level of consciousness: awake and alert and oriented Pain management: pain level controlled Vital Signs Assessment: post-procedure vital signs reviewed and stable Respiratory status: spontaneous breathing, nonlabored ventilation and respiratory function stable Cardiovascular status: blood pressure returned to baseline and stable Postop Assessment: no signs of nausea or vomiting Anesthetic complications: no     Last Vitals:  Vitals:   05/03/17 1020 05/03/17 1030  BP: (!) 113/59 109/74  Pulse: 77 81  Resp: 16 14  Temp:    SpO2: 99% 99%    Last Pain:  Vitals:   05/03/17 1000  TempSrc: Tympanic                 Aul Mangieri

## 2017-05-03 NOTE — Anesthesia Preprocedure Evaluation (Signed)
Anesthesia Evaluation  Patient identified by MRN, date of birth, ID band Patient awake    Reviewed: Allergy & Precautions, NPO status , Patient's Chart, lab work & pertinent test results  History of Anesthesia Complications Negative for: history of anesthetic complications  Airway Mallampati: II  TM Distance: >3 FB Neck ROM: Full    Dental  (+) Poor Dentition   Pulmonary neg pulmonary ROS, neg sleep apnea, neg COPD,    breath sounds clear to auscultation- rhonchi (-) wheezing      Cardiovascular hypertension, Pt. on medications (-) CAD, (-) Past MI and (-) Cardiac Stents  Rhythm:Regular Rate:Normal - Systolic murmurs and - Diastolic murmurs    Neuro/Psych negative neurological ROS  negative psych ROS   GI/Hepatic Neg liver ROS, GERD  ,  Endo/Other  diabetes  Renal/GU Renal disease: polycystic kidney disease.     Musculoskeletal  (+) Arthritis ,   Abdominal (+) + obese,   Peds  Hematology  (+) anemia ,   Anesthesia Other Findings Past Medical History: No date: Anemia No date: Arthritis No date: Diabetes mellitus without complication ([facility])     Comment:  gestational diabetes in 1990 No date: GERD (gastroesophageal reflux disease) No date: Hypertension No date: Polycystic kidney No date: Pre-diabetes No date: Vaginal bleeding   Reproductive/Obstetrics                             Anesthesia Physical Anesthesia Plan  ASA: III  Anesthesia Plan: General   Post-op Pain Management:    Induction: Intravenous  PONV Risk Score and Plan: 2 and Propofol infusion  Airway Management Planned: Natural Airway  Additional Equipment:   Intra-op Plan:   Post-operative Plan:   Informed Consent: I have reviewed the patients History and Physical, chart, labs and discussed the procedure including the risks, benefits and alternatives for the proposed anesthesia with the patient or authorized  representative who has indicated his/her understanding and acceptance.   Dental advisory given  Plan Discussed with: CRNA and Anesthesiologist  Anesthesia Plan Comments:         Anesthesia Quick Evaluation

## 2017-05-03 NOTE — Anesthesia Post-op Follow-up Note (Signed)
Anesthesia QCDR form completed.        

## 2017-05-03 NOTE — H&P (Signed)
Jonathon Bellows MD [address], [place], [place] [address] Phone: [phone] Fax : [phone]  Primary Care Physician:  System, Pcp Not In Primary Gastroenterologist:  Dr. Jonathon Bellows   Pre-Procedure History & Physical: HPI:  Lori Nguyen is a 61 y.o. female is here for an colonoscopy.   Past Medical History:  Diagnosis Date  . Anemia   . Arthritis   . Diabetes mellitus without complication ([place])    gestational diabetes in 66  . GERD (gastroesophageal reflux disease)   . Hypertension   . Polycystic kidney     Past Surgical History:  Procedure Laterality Date  . BREAST EXCISIONAL BIOPSY Left 2001   benign  . BREAST SURGERY Left    Breast Biopsy  . CESAREAN SECTION  1990  . HIP SURGERY     history of R hip repair  . JOINT REPLACEMENT Right January 2016   Hip Replacement, Dr. Rudene Christians  . TONSILLECTOMY    . TOTAL HIP ARTHROPLASTY Left 06/15/2015   Procedure: TOTAL HIP ARTHROPLASTY ANTERIOR APPROACH;  Surgeon: Hessie Knows, MD;  Location: [facility] ORS;  Service: Orthopedics;  Laterality: Left;  . TUBAL LIGATION      Prior to Admission medications   Medication Sig Start Date End Date Taking? Authorizing Provider  aspirin EC 81 MG tablet Take 81 mg by mouth daily.    [provider]  Biotin 10 MG CAPS Take 1 capsule by mouth daily.    [provider]  Cholecalciferol (VITAMIN D3) 5000 UNITS CAPS Take 1 capsule by mouth daily.    [provider]  enoxaparin (LOVENOX) 40 MG/0.4ML injection Inject 0.4 mLs (40 mg total) into the skin daily. Patient not taking: Reported on 03/28/2017 06/17/15   Duanne Guess, PA-C  etodolac (LODINE) 400 MG tablet  04/12/17   [provider]  Ferrous Sulfate (IRON) 142 (45 FE) MG TBCR Take 1 tablet by mouth daily.    [provider]  losartan-hydrochlorothiazide (HYZAAR) 100-25 MG tablet Take 1 tablet by mouth daily.    [provider]  oxyCODONE (OXY IR/ROXICODONE) 5 MG immediate release  tablet Take 1-2 tablets (5-10 mg total) by mouth every 3 (three) hours as needed for breakthrough pain. 06/17/15   Duanne Guess, PA-C  pantoprazole (PROTONIX) 40 MG tablet Take 40 mg by mouth daily.    [provider]  potassium chloride (K-DUR) 10 MEQ tablet  04/12/17   [provider]  traMADol (ULTRAM) 50 MG tablet Take 50 mg by mouth 3 (three) times daily.    [provider]    Allergies as of 04/16/2017 - Review Complete 03/28/2017  Allergen Reaction Noted  . Latex Swelling 05/01/2015    Family History  Problem Relation Age of Onset  . Hypertension Mother   . Lung cancer Father   . Lung cancer Sister   . Breast cancer Neg Hx     Social History   Social History  . Marital status: Married    Spouse name: N/A  . Number of children: N/A  . Years of education: N/A   Occupational History  . Not on file.   Social History Main Topics  . Smoking status: Never Smoker  . Smokeless tobacco: Never Used  . Alcohol use Yes     Comment: occ  . Drug use: No  . Sexual activity: Not Currently    Birth control/ protection: Post-menopausal   Other Topics Concern  . Not on file   Social History Narrative  .  No narrative on file    Review of Systems: See HPI, otherwise negative ROS  Physical Exam: There were no vitals taken for this visit. General:   Alert,  pleasant and cooperative in NAD Head:  Normocephalic and atraumatic. Neck:  Supple; no masses or thyromegaly. Lungs:  Clear throughout to auscultation.    Heart:  Regular rate and rhythm. Abdomen:  Soft, nontender and nondistended. Normal bowel sounds, without guarding, and without rebound.   Neurologic:  Alert and  oriented x4;  grossly normal neurologically.  Impression/Plan: Lori Nguyen is here for an colonoscopy to be performed for Screening colonoscopy average risk    Risks, benefits, limitations, and alternatives regarding  colonoscopy have been reviewed with the patient.   Questions have been answered.  All parties agreeable.   Jonathon Bellows, MD  05/03/2017, 8:57 AM

## 2017-05-03 NOTE — Transfer of Care (Signed)
Immediate Anesthesia Transfer of Care Note  Patient: Lori Nguyen  Procedure(s) Performed: COLONOSCOPY WITH PROPOFOL (N/A )  Patient Location: PACU  Anesthesia Type:General  Level of Consciousness: awake and sedated  Airway & Oxygen Therapy: Patient Spontanous Breathing and Patient connected to nasal cannula oxygen  Post-op Assessment: Report given to RN and Post -op Vital signs reviewed and stable  Post vital signs: Reviewed and stable  Last Vitals:  Vitals:   05/03/17 0904  BP: 127/67  Pulse: 73  Resp: 14  Temp: (!) 35.9 C  SpO2: 100%    Last Pain:  Vitals:   05/03/17 0904  TempSrc: Tympanic         Complications: No apparent anesthesia complications

## 2017-05-03 NOTE — Op Note (Signed)
[facility] Patient Name: Lori Nguyen Procedure Date: 05/03/2017 9:11 AM MRN: [id number] Account #: [id number] Date of Birth: [birth year] Admit Type: Outpatient Age: 61 Room: [facility] ENDO ROOM 3 Gender: Female Note Status: Finalized Procedure:            Colonoscopy Indications:          Screening for colorectal malignant neoplasm Providers:            Jonathon Bellows MD, MD Referring MD:         Meindert A. Brunetta Genera, MD (Referring MD) Medicines:            Monitored Anesthesia Care Complications:        No immediate complications. Procedure:            Pre-Anesthesia Assessment:                       - Prior to the procedure, a History and Physical was                        performed, and patient medications, allergies and                        sensitivities were reviewed. The patient's tolerance of                        previous anesthesia was reviewed.                       - The risks and benefits of the procedure and the                        sedation options and risks were discussed with the                        patient. All questions were answered and informed                        consent was obtained.                       - ASA Grade Assessment: III - A patient with severe                        systemic disease.                       After obtaining informed consent, the colonoscope was                        passed under direct vision. Throughout the procedure,                        the patient's blood pressure, pulse, and oxygen                        saturations were monitored continuously. The Olympus                        CF-H180AL colonoscope ( S#: [id number] ) was introduced  through the anus and advanced to the the cecum,                        identified by the appendiceal orifice, IC valve and                        transillumination. The colonoscopy was performed with                        ease. The  patient tolerated the procedure well. The                        quality of the bowel preparation was good. Findings:      The perianal and digital rectal examinations were normal.      Multiple medium-mouthed diverticula were found in the sigmoid colon.      Three sessile polyps were found in the cecum. The polyps were 3 to 4 mm       in size. These polyps were removed with a cold biopsy forceps. Resection       and retrieval were complete.      Two sessile polyps were found in the sigmoid colon and transverse colon.       The polyps were 3 to 4 mm in size. These polyps were removed with a cold       biopsy forceps. Resection and retrieval were complete.      A 6 mm polyp was found in the sigmoid colon. The polyp was sessile. The       polyp was removed with a cold snare. Resection and retrieval were       complete.      The exam was otherwise without abnormality on direct and retroflexion       views. Impression:           - Diverticulosis in the sigmoid colon.                       - Three 3 to 4 mm polyps in the cecum, removed with a                        cold biopsy forceps. Resected and retrieved.                       - Two 3 to 4 mm polyps in the sigmoid colon and in the                        transverse colon, removed with a cold biopsy forceps.                        Resected and retrieved.                       - One 6 mm polyp in the sigmoid colon, removed with a                        cold snare. Resected and retrieved.                       - The examination was otherwise normal on direct and  retroflexion views. Recommendation:       - Discharge patient to home (with escort).                       - Resume previous diet.                       - Continue present medications.                       - Await pathology results.                       - Repeat colonoscopy in 3 - 5 years for surveillance                        based on pathology  results. Procedure Code(s):    --- Professional ---                       [phone], Colonoscopy, flexible; with removal of tumor(s),                        polyp(s), or other lesion(s) by snare technique                       45380, 46, Colonoscopy, flexible; with biopsy, single                        or multiple Diagnosis Code(s):    --- Professional ---                       Z12.11, Encounter for screening for malignant neoplasm                        of colon                       D12.0, Benign neoplasm of cecum                       D12.5, Benign neoplasm of sigmoid colon                       D12.3, Benign neoplasm of transverse colon (hepatic                        flexure or splenic flexure)                       K57.30, Diverticulosis of large intestine without                        perforation or abscess without bleeding CPT copyright 2016 American Medical Association. All rights reserved. The codes documented in this report are preliminary and upon coder review may  be revised to meet current compliance requirements. Jonathon Bellows, MD Jonathon Bellows MD, MD 05/03/2017 9:56:10 AM This report has been signed electronically. Number of Addenda: 0 Note Initiated On: 05/03/2017 9:11 AM Scope Withdrawal Time: 0 hours 20 minutes 0 seconds  Total Procedure Duration: 0 hours 27 minutes 16 seconds       [facility]

## 2017-05-04 ENCOUNTER — Encounter: Payer: Self-pay | Admitting: Gastroenterology

## 2017-05-04 DIAGNOSIS — N200 Calculus of kidney: Secondary | ICD-10-CM | POA: Insufficient documentation

## 2017-05-04 DIAGNOSIS — Q613 Polycystic kidney, unspecified: Secondary | ICD-10-CM | POA: Insufficient documentation

## 2017-05-04 DIAGNOSIS — I10 Essential (primary) hypertension: Secondary | ICD-10-CM | POA: Insufficient documentation

## 2017-05-04 DIAGNOSIS — B019 Varicella without complication: Secondary | ICD-10-CM | POA: Insufficient documentation

## 2017-05-04 LAB — SURGICAL PATHOLOGY

## 2017-05-07 ENCOUNTER — Encounter: Payer: Self-pay | Admitting: Gastroenterology

## 2017-05-09 ENCOUNTER — Encounter: Payer: Self-pay | Admitting: Obstetrics & Gynecology

## 2017-05-09 ENCOUNTER — Ambulatory Visit (INDEPENDENT_AMBULATORY_CARE_PROVIDER_SITE_OTHER): Payer: 59 | Admitting: Obstetrics & Gynecology

## 2017-05-09 VITALS — BP 120/80 | HR 73 | Ht 60.0 in | Wt 154.0 lb

## 2017-05-09 DIAGNOSIS — R9389 Abnormal findings on diagnostic imaging of other specified body structures: Secondary | ICD-10-CM

## 2017-05-09 DIAGNOSIS — N950 Postmenopausal bleeding: Secondary | ICD-10-CM | POA: Diagnosis not present

## 2017-05-09 NOTE — Patient Instructions (Signed)
Hysteroscopy, Care After  Refer to this sheet in the next few weeks. These instructions provide you with information on caring for yourself after your procedure. Your health care provider may also give you more specific instructions. Your treatment has been planned according to current medical practices, but problems sometimes occur. Call your health care provider if you have any problems or questions after your procedure.  What can I expect after the procedure?  After your procedure, it is typical to have the following:  · You may have some cramping. This normally lasts for a couple days.  · You may have bleeding. This can vary from light spotting for a few days to menstrual-like bleeding for 3-7 days.    Follow these instructions at home:  · Rest for the first 1-2 days after the procedure.  · Only take over-the-counter or prescription medicines as directed by your health care provider. Do not take aspirin. It can increase the chances of bleeding.  · Take showers instead of baths for 2 weeks or as directed by your health care provider.  · Do not drive for 24 hours or as directed.  · Do not drink alcohol while taking pain medicine.  · Do not use tampons, douche, or have sexual intercourse for 2 weeks or until your health care provider says it is okay.  · Take your temperature twice a day for 4-5 days. Write it down each time.  · Follow your health care provider's advice about diet, exercise, and lifting.  · If you develop constipation, you may:  ? Take a mild laxative if your health care provider approves.  ? Add bran foods to your diet.  ? Drink enough fluids to keep your urine clear or pale yellow.  · Try to have someone with you or available to you for the first 24-48 hours, especially if you were given a general anesthetic.  · Follow up with your health care provider as directed.  Contact a health care provider if:  · You feel dizzy or lightheaded.  · You feel sick to your stomach (nauseous).  · You have  abnormal vaginal discharge.  · You have a rash.  · You have pain that is not controlled with medicine.  Get help right away if:  · You have bleeding that is heavier than a normal menstrual period.  · You have a fever.  · You have increasing cramps or pain, not controlled with medicine.  · You have new belly (abdominal) pain.  · You pass out.  · You have pain in the tops of your shoulders (shoulder strap areas).  · You have shortness of breath.  This information is not intended to replace advice given to you by your health care provider. Make sure you discuss any questions you have with your health care provider.  Document Released: 05/07/2013 Document Revised: 12/23/2015 Document Reviewed: 02/13/2013  Elsevier Interactive Patient Education © 2017 Elsevier Inc.

## 2017-05-09 NOTE — Addendum Note (Signed)
Addended by: Gae Dry on: 05/09/2017 05:05 PM   Modules accepted: Orders, SmartSet

## 2017-05-09 NOTE — Progress Notes (Signed)
PRE-OPERATIVE HISTORY AND PHYSICAL EXAM  HPI:  Lori Nguyen is a 61 y.o. G1P1001 No LMP recorded. Patient is postmenopausal.; she is being admitted for surgery related to PMB and Endometrial Thickening.Postmenopausal bleeding is off and on over the last year.  Not heavy.  [place] w some endometrial thickening.  Unable to do office EMB.  PMHx: Past Medical History:  Diagnosis Date  . Anemia   . Arthritis   . Diabetes mellitus without complication ([place])    gestational diabetes in 74  . GERD (gastroesophageal reflux disease)   . Hypertension   . Polycystic kidney   . Pre-diabetes   . Vaginal bleeding    Past Surgical History:  Procedure Laterality Date  . BREAST EXCISIONAL BIOPSY Left 2001   benign  . BREAST SURGERY Left    Breast Biopsy  . CESAREAN SECTION  1990  . COLONOSCOPY    . COLONOSCOPY WITH PROPOFOL N/A 05/03/2017   Procedure: COLONOSCOPY WITH PROPOFOL;  Surgeon: Jonathon Bellows, MD;  Location: [facility] ENDOSCOPY;  Service: Gastroenterology;  Laterality: N/A;  . HIP SURGERY     history of R hip repair  . JOINT REPLACEMENT Right January 2016   Hip Replacement, Dr. Rudene Christians  . TONSILLECTOMY    . TOTAL HIP ARTHROPLASTY Left 06/15/2015   Procedure: TOTAL HIP ARTHROPLASTY ANTERIOR APPROACH;  Surgeon: Hessie Knows, MD;  Location: [facility] ORS;  Service: Orthopedics;  Laterality: Left;  . TUBAL LIGATION     Family History  Problem Relation Age of Onset  . Hypertension Mother   . Lung cancer Father   . Lung cancer Sister   . Breast cancer Neg Hx    Social History  Substance Use Topics  . Smoking status: Never Smoker  . Smokeless tobacco: Never Used  . Alcohol use Yes     Comment: occ none last 24hrs    Current Outpatient Prescriptions:  .  aspirin EC 81 MG tablet, Take 81 mg by mouth daily., Disp: , Rfl:  .  cephALEXin (KEFLEX) 500 MG capsule, Take 2,000 mg by mouth See admin instructions. TAKE 4 CAPSULES (2000 MG) 1 HOUR PRIOR TO DENTAL PROCEDURES (HIP REPLACEMENT), Disp: ,  Rfl:  .  Cholecalciferol (VITAMIN D3 PO), Take 1 tablet by mouth daily., Disp: , Rfl:  .  etodolac (LODINE) 400 MG tablet, Take 400 mg by mouth 2 (two) times daily as needed (for pain.). , Disp: , Rfl:  .  ferrous sulfate 325 (65 FE) MG tablet, Take 325 mg by mouth daily with breakfast., Disp: , Rfl:  .  losartan-hydrochlorothiazide (HYZAAR) 100-25 MG tablet, Take 1 tablet by mouth daily., Disp: , Rfl:  .  pantoprazole (PROTONIX) 40 MG tablet, Take 40 mg by mouth daily., Disp: , Rfl:  .  potassium chloride (K-DUR) 10 MEQ tablet, Take 10 mEq by mouth daily. , Disp: , Rfl:  Allergies: Latex  Review of Systems  Constitutional: Negative for chills, fever and malaise/fatigue.  HENT: Negative for congestion, sinus pain and sore throat.   Eyes: Negative for blurred vision and pain.  Respiratory: Negative for cough and wheezing.   Cardiovascular: Negative for chest pain and leg swelling.  Gastrointestinal: Negative for abdominal pain, constipation, diarrhea, heartburn, nausea and vomiting.  Genitourinary: Negative for dysuria, frequency, hematuria and urgency.  Musculoskeletal: Negative for back pain, joint pain, myalgias and neck pain.  Skin: Negative for itching and rash.  Neurological: Negative for dizziness, tremors and weakness.  Endo/Heme/Allergies: Does not bruise/bleed easily.  Psychiatric/Behavioral: Negative for  depression. The patient is not nervous/anxious and does not have insomnia.    Objective: BP 120/80   Pulse 73   Ht 5' (1.524 m)   Wt 154 lb (69.9 kg)   BMI 30.08 kg/m   Filed Weights   05/09/17 1621  Weight: 154 lb (69.9 kg)   Physical Exam  Constitutional: She is oriented to person, place, and time. She appears well-developed and well-nourished. No distress.  Genitourinary: Rectum normal, vagina normal and uterus normal. Pelvic exam was performed with patient supine. There is no rash or lesion on the right labia. There is no rash or lesion on the left labia. Vagina  exhibits no lesion. No bleeding in the vagina. Right adnexum does not display mass and does not display tenderness. Left adnexum does not display mass and does not display tenderness. Cervix does not exhibit motion tenderness, lesion, friability or polyp.   Uterus is mobile and midaxial. Uterus is not enlarged or exhibiting a mass.  HENT:  Head: Normocephalic and atraumatic. Head is without laceration.  Right Ear: Hearing normal.  Left Ear: Hearing normal.  Nose: No epistaxis.  No foreign bodies.  Mouth/Throat: Uvula is midline, oropharynx is clear and moist and mucous membranes are normal.  Eyes: Pupils are equal, round, and reactive to light.  Neck: Normal range of motion. Neck supple. No thyromegaly present.  Cardiovascular: Normal rate and regular rhythm.  Exam reveals no gallop and no friction rub.   No murmur heard. Pulmonary/Chest: Effort normal and breath sounds normal. No respiratory distress. She has no wheezes. Right breast exhibits no mass, no skin change and no tenderness. Left breast exhibits no mass, no skin change and no tenderness.  Abdominal: Soft. Bowel sounds are normal. She exhibits no distension. There is no tenderness. There is no rebound.  Musculoskeletal: Normal range of motion.  Neurological: She is alert and oriented to person, place, and time. No cranial nerve deficit.  Skin: Skin is warm and dry.  Psychiatric: She has a normal mood and affect. Judgment normal.  Vitals reviewed.  Assessment: 1. Endometrial thickening on ultrasound   2. Postmenopausal bleeding   Plan Hyst D&C Polypectomy  I have had a careful discussion with this patient about all the options available and the risk/benefits of each. I have fully informed this patient that surgery may subject her to a variety of discomforts and risks: She understands that most patients have surgery with little difficulty, but problems can happen ranging from minor to fatal. These include nausea, vomiting, pain,  bleeding, infection, poor healing, hernia, or formation of adhesions. Unexpected reactions may occur from any drug or anesthetic given. Unintended injury may occur to other pelvic or abdominal structures such as Fallopian tubes, ovaries, bladder, ureter (tube from kidney to bladder), or bowel. Nerves going from the pelvis to the legs may be injured. Any such injury may require immediate or later additional surgery to correct the problem. Excessive blood loss requiring transfusion is very unlikely but possible. Dangerous blood clots may form in the legs or lungs. Physical and sexual activity will be restricted in varying degrees for an indeterminate period of time but most often 2-6 weeks.  Finally, she understands that it is impossible to list every possible undesirable effect and that the condition for which surgery is done is not always cured or significantly improved, and in rare cases may be even worse.Ample time was given to answer all questions.  Barnett Applebaum, MD, [facility], [facility] 05/09/2017  4:52  PM

## 2017-05-11 ENCOUNTER — Encounter
Admission: RE | Admit: 2017-05-11 | Discharge: 2017-05-11 | Disposition: A | Discharge status: Home / Self Care | Payer: 59 | Source: Ambulatory Visit | Attending: Obstetrics & Gynecology | Admitting: Obstetrics & Gynecology

## 2017-05-11 NOTE — Patient Instructions (Signed)
Your procedure is scheduled on: 05-17-17 THURSDAY Report to Same Day Surgery 2nd floor medical mall [facility] Entrance-take elevator on left to 2nd floor.  Check in with surgery information desk.) To find out your arrival time please call [phone] between 1PM - 3PM on 05-16-17 [facility]  Remember: Instructions that are not followed completely may result in serious medical risk, up to and including death, or upon the discretion of your surgeon and anesthesiologist your surgery may need to be rescheduled.    _x___ 1. Do not eat food after midnight the night before your procedure. NO GUM CHEWING OR HARD CANDIES.  You may drink clear liquids up to 2 hours before you are scheduled to arrive at the hospital for your procedure.  Do not drink clear liquids within 2 hours of your scheduled arrival to the hospital.  Clear liquids include  --Water or Apple juice without pulp  --Clear carbohydrate beverage such as ClearFast or Gatorade  --Black Coffee or Clear Tea (No milk, no creamers, do not add anything to the coffee or Tea)  Type 1 and type 2 diabetics should only drink water.     __x__ 2. No Alcohol for 24 hours before or after surgery.   __x__3. No Smoking for 24 prior to surgery.   ____  4. Bring all medications with you on the day of surgery if instructed.    __x__ 5. Notify your doctor if there is any change in your medical condition     (cold, fever, infections).     Do not wear jewelry, make-up, hairpins, clips or nail polish.  Do not wear lotions, powders, or perfumes. You may wear deodorant.  Do not shave 48 hours prior to surgery. Men may shave face and neck.  Do not bring valuables to the hospital.    [facility] is not responsible for any belongings or valuables.               Contacts, dentures or bridgework may not be worn into surgery.  Leave your suitcase in the car. After surgery it may be brought to your room.  For patients admitted to the hospital, discharge  time is determined by your treatment team.   Patients discharged the day of surgery will not be allowed to drive home.  You will need someone to drive you home and stay with you the night of your procedure.    Please read over the following fact sheets that you were given:   [facility] Preparing for Surgery and or MRSA Information   _x___ TAKE THE FOLLOWING MEDICATIONS THE MORNING OF SURGERY WITH A SMALL SIP OF WATER. These include:  1. PROTONIX  2. TAKE AN EXTRA PROTONIX Wednesday NIGHT BEFORE BED  3.  4.  5.  6.  ____Fleets enema or Magnesium Citrate as directed.   _x___ Use CHG Soap or sage wipes as directed on instruction sheet   ____ Use inhalers on the day of surgery and bring to hospital day of surgery  ____ Stop Metformin and Janumet 2 days prior to surgery.    ____ Take 1/2 of usual insulin dose the night before surgery and none on the morning surgery.   _x___ Follow recommendations from Cardiologist, Pulmonologist or PCP regarding stopping Aspirin, Coumadin, Plavix ,Eliquis, Effient, or Pradaxa, and Pletal-STOP ASPIRIN NOW  X____Stop Anti-inflammatories such as Advil, Aleve, Ibuprofen, Motrin, Naproxen,ETODOLAC, Naprosyn, Goodies powders or aspirin products NOW-OK to take Tylenol    ____ Stop supplements until after surgery.  ____ Bring C-Pap to the hospital.

## 2017-05-15 ENCOUNTER — Encounter
Admission: RE | Admit: 2017-05-15 | Discharge: 2017-05-15 | Disposition: A | Discharge status: Home / Self Care | Payer: 59 | Source: Ambulatory Visit | Attending: Obstetrics & Gynecology | Admitting: Obstetrics & Gynecology

## 2017-05-15 ENCOUNTER — Telehealth: Payer: Self-pay

## 2017-05-15 DIAGNOSIS — R9389 Abnormal findings on diagnostic imaging of other specified body structures: Secondary | ICD-10-CM | POA: Diagnosis not present

## 2017-05-15 DIAGNOSIS — E119 Type 2 diabetes mellitus without complications: Secondary | ICD-10-CM | POA: Diagnosis not present

## 2017-05-15 DIAGNOSIS — I739 Peripheral vascular disease, unspecified: Secondary | ICD-10-CM | POA: Diagnosis not present

## 2017-05-15 DIAGNOSIS — N882 Stricture and stenosis of cervix uteri: Secondary | ICD-10-CM | POA: Diagnosis not present

## 2017-05-15 DIAGNOSIS — K219 Gastro-esophageal reflux disease without esophagitis: Secondary | ICD-10-CM | POA: Diagnosis not present

## 2017-05-15 DIAGNOSIS — I10 Essential (primary) hypertension: Secondary | ICD-10-CM | POA: Diagnosis not present

## 2017-05-15 DIAGNOSIS — M1990 Unspecified osteoarthritis, unspecified site: Secondary | ICD-10-CM | POA: Diagnosis not present

## 2017-05-15 DIAGNOSIS — N950 Postmenopausal bleeding: Secondary | ICD-10-CM | POA: Diagnosis not present

## 2017-05-15 LAB — CBC
HCT: 35.8 % (ref 35.0–47.0)
Hemoglobin: 12 g/dL (ref 12.0–16.0)
MCH: 28.6 pg (ref 26.0–34.0)
MCHC: 33.5 g/dL (ref 32.0–36.0)
MCV: 85.2 fL (ref 80.0–100.0)
Platelets: 188 10*3/uL (ref 150–440)
RBC: 4.2 MIL/uL (ref 3.80–5.20)
RDW: 13.8 % (ref 11.5–14.5)
WBC: 6.6 10*3/uL (ref 3.6–11.0)

## 2017-05-15 LAB — TYPE AND SCREEN
ABO/RH(D): A POS
Antibody Screen: NEGATIVE

## 2017-05-15 LAB — BASIC METABOLIC PANEL
Anion gap: 8 (ref 5–15)
BUN: 17 mg/dL (ref 6–20)
CO2: 29 mmol/L (ref 22–32)
Calcium: 9.4 mg/dL (ref 8.9–10.3)
Chloride: 103 mmol/L (ref 101–111)
Creatinine, Ser: 0.95 mg/dL (ref 0.44–1.00)
GFR calc Af Amer: >60 mL/min (ref >60)
GFR calc non Af Amer: >60 mL/min (ref >60)
Glucose, Bld: 192 mg/dL — ABNORMAL HIGH (ref 65–99)
Potassium: 3 mmol/L — ABNORMAL LOW (ref 3.5–5.1)
Sodium: 140 mmol/L (ref 135–145)

## 2017-05-15 NOTE — Telephone Encounter (Signed)
Heather from pre admit testing calling to let us know pt Potassium is low the results were a 3

## 2017-05-17 ENCOUNTER — Encounter: Payer: Self-pay | Admitting: *Deleted

## 2017-05-17 ENCOUNTER — Ambulatory Visit: Payer: 59 | Admitting: Anesthesiology

## 2017-05-17 ENCOUNTER — Ambulatory Visit
Admission: RE | Admit: 2017-05-17 | Discharge: 2017-05-17 | Disposition: A | Discharge status: Home / Self Care | Payer: 59 | Source: Ambulatory Visit | Attending: Obstetrics & Gynecology | Admitting: Obstetrics & Gynecology

## 2017-05-17 ENCOUNTER — Encounter
Admission: RE | Disposition: A | Discharge status: Home / Self Care | Payer: Self-pay | Source: Ambulatory Visit | Attending: Obstetrics & Gynecology

## 2017-05-17 DIAGNOSIS — M1990 Unspecified osteoarthritis, unspecified site: Secondary | ICD-10-CM | POA: Insufficient documentation

## 2017-05-17 DIAGNOSIS — N950 Postmenopausal bleeding: Secondary | ICD-10-CM | POA: Diagnosis not present

## 2017-05-17 DIAGNOSIS — K219 Gastro-esophageal reflux disease without esophagitis: Secondary | ICD-10-CM | POA: Insufficient documentation

## 2017-05-17 DIAGNOSIS — I10 Essential (primary) hypertension: Secondary | ICD-10-CM | POA: Insufficient documentation

## 2017-05-17 DIAGNOSIS — N882 Stricture and stenosis of cervix uteri: Secondary | ICD-10-CM | POA: Insufficient documentation

## 2017-05-17 DIAGNOSIS — R9389 Abnormal findings on diagnostic imaging of other specified body structures: Secondary | ICD-10-CM | POA: Insufficient documentation

## 2017-05-17 DIAGNOSIS — I739 Peripheral vascular disease, unspecified: Secondary | ICD-10-CM | POA: Insufficient documentation

## 2017-05-17 DIAGNOSIS — E119 Type 2 diabetes mellitus without complications: Secondary | ICD-10-CM | POA: Insufficient documentation

## 2017-05-17 HISTORY — PX: DILATION AND CURETTAGE OF UTERUS: SHX78

## 2017-05-17 LAB — POCT I-STAT 4, (NA,K, GLUC, HGB,HCT)
Glucose, Bld: 108 mg/dL — ABNORMAL HIGH (ref 65–99)
HCT: 34 % — ABNORMAL LOW (ref 36.0–46.0)
Hemoglobin: 11.6 g/dL — ABNORMAL LOW (ref 12.0–15.0)
Potassium: 3.5 mmol/L (ref 3.5–5.1)
Sodium: 142 mmol/L (ref 135–145)

## 2017-05-17 LAB — GLUCOSE, CAPILLARY
Glucose-Capillary: 105 mg/dL — ABNORMAL HIGH (ref 65–99)
Glucose-Capillary: 95 mg/dL (ref 65–99)

## 2017-05-17 SURGERY — DILATION AND CURETTAGE
Anesthesia: General | Wound class: Clean Contaminated

## 2017-05-17 MED ORDER — ONDANSETRON HCL 4 MG/2ML IJ SOLN: 4.0000 mg | Freq: Once | INTRAMUSCULAR | Status: DC | PRN

## 2017-05-17 MED ORDER — OXYCODONE-ACETAMINOPHEN 5-325 MG PO TABS: 1.0000 | ORAL_TABLET | ORAL | 0 refills | Status: DC | PRN

## 2017-05-17 MED ORDER — KETOROLAC TROMETHAMINE 30 MG/ML IJ SOLN
INTRAMUSCULAR | Status: AC
  Filled 2017-05-17: qty 1

## 2017-05-17 MED ORDER — PHENYLEPHRINE HCL 10 MG/ML IJ SOLN
INTRAMUSCULAR | Status: DC | PRN
  Administered 2017-05-17 (×2): 100 ug via INTRAVENOUS

## 2017-05-17 MED ORDER — LIDOCAINE HCL (CARDIAC) 20 MG/ML IV SOLN
INTRAVENOUS | Status: DC | PRN
Start: 2017-05-17 — End: 2017-05-17
  Administered 2017-05-17: 60 mg via INTRAVENOUS

## 2017-05-17 MED ORDER — ACETAMINOPHEN 325 MG PO TABS: 650.0000 mg | ORAL_TABLET | ORAL | Status: DC | PRN

## 2017-05-17 MED ORDER — MIDAZOLAM HCL 2 MG/2ML IJ SOLN
INTRAMUSCULAR | Status: DC | PRN
  Administered 2017-05-17: 2 mg via INTRAVENOUS

## 2017-05-17 MED ORDER — PROPOFOL 10 MG/ML IV BOLUS
INTRAVENOUS | Status: DC | PRN
  Administered 2017-05-17: 150 mg via INTRAVENOUS

## 2017-05-17 MED ORDER — GLYCOPYRROLATE 0.2 MG/ML IJ SOLN
INTRAMUSCULAR | Status: DC | PRN
  Administered 2017-05-17: 0.2 mg via INTRAVENOUS

## 2017-05-17 MED ORDER — MIDAZOLAM HCL 2 MG/2ML IJ SOLN
INTRAMUSCULAR | Status: AC
  Filled 2017-05-17: qty 2

## 2017-05-17 MED ORDER — FENTANYL CITRATE (PF) 100 MCG/2ML IJ SOLN
INTRAMUSCULAR | Status: DC | PRN
  Administered 2017-05-17: 25 ug via INTRAVENOUS

## 2017-05-17 MED ORDER — FENTANYL CITRATE (PF) 100 MCG/2ML IJ SOLN
INTRAMUSCULAR | Status: AC
  Filled 2017-05-17: qty 2

## 2017-05-17 MED ORDER — MORPHINE SULFATE (PF) 4 MG/ML IV SOLN: 1.0000 mg | INTRAVENOUS | Status: DC | PRN

## 2017-05-17 MED ORDER — LACTATED RINGERS IV SOLN: INTRAVENOUS | Status: DC

## 2017-05-17 MED ORDER — ACETAMINOPHEN 650 MG RE SUPP
650.0000 mg | RECTAL | Status: DC | PRN
  Filled 2017-05-17: qty 1

## 2017-05-17 MED ORDER — SODIUM CHLORIDE 0.9 % IV SOLN
INTRAVENOUS | Status: DC
  Administered 2017-05-17: 09:00:00 via INTRAVENOUS

## 2017-05-17 MED ORDER — KETOROLAC TROMETHAMINE 30 MG/ML IJ SOLN
30.0000 mg | Freq: Four times a day (QID) | INTRAMUSCULAR | Status: DC
  Administered 2017-05-17: 30 mg via INTRAVENOUS

## 2017-05-17 MED ORDER — DEXAMETHASONE SODIUM PHOSPHATE 10 MG/ML IJ SOLN
INTRAMUSCULAR | Status: DC | PRN
  Administered 2017-05-17: 5 mg via INTRAVENOUS

## 2017-05-17 MED ORDER — FENTANYL CITRATE (PF) 100 MCG/2ML IJ SOLN
INTRAMUSCULAR | Status: AC
  Administered 2017-05-17: 25 ug via INTRAVENOUS
  Filled 2017-05-17: qty 2

## 2017-05-17 MED ORDER — ONDANSETRON HCL 4 MG/2ML IJ SOLN
INTRAMUSCULAR | Status: DC | PRN
  Administered 2017-05-17: 4 mg via INTRAVENOUS

## 2017-05-17 MED ORDER — FENTANYL CITRATE (PF) 100 MCG/2ML IJ SOLN
25.0000 ug | INTRAMUSCULAR | Status: DC | PRN
  Administered 2017-05-17 (×3): 25 ug via INTRAVENOUS

## 2017-05-17 MED ORDER — EPHEDRINE SULFATE 50 MG/ML IJ SOLN
INTRAMUSCULAR | Status: DC | PRN
  Administered 2017-05-17: 5 mg via INTRAVENOUS

## 2017-05-17 SURGICAL SUPPLY — 23 items
ABLATOR ENDOMETRIAL MYOSURE (ABLATOR) IMPLANT
BAG COUNTER SPONGE EZ (MISCELLANEOUS) ×2 IMPLANT
CANISTER SUC SOCK COL 7IN (MISCELLANEOUS) ×3 IMPLANT
CATH ROBINSON RED A/P 16FR (CATHETERS) ×3 IMPLANT
COUNTER SPONGE BAG EZ (MISCELLANEOUS) ×1
DEVICE MYOSURE LITE (MISCELLANEOUS) IMPLANT
ELECT REM PT RETURN 9FT ADLT (ELECTROSURGICAL) ×3
ELECTRODE REM PT RTRN 9FT ADLT (ELECTROSURGICAL) ×1 IMPLANT
GLOVE BIO SURGEON STRL SZ8 (GLOVE) ×3 IMPLANT
GOWN STRL REUS W/ TWL LRG LVL3 (GOWN DISPOSABLE) ×1 IMPLANT
GOWN STRL REUS W/ TWL XL LVL3 (GOWN DISPOSABLE) ×1 IMPLANT
GOWN STRL REUS W/TWL LRG LVL3 (GOWN DISPOSABLE) ×2
GOWN STRL REUS W/TWL XL LVL3 (GOWN DISPOSABLE) ×2
PACK DNC HYST (MISCELLANEOUS) ×3 IMPLANT
PAD OB MATERNITY 4.3X12.25 (PERSONAL CARE ITEMS) ×3 IMPLANT
PAD PREP 24X41 OB/GYN DISP (PERSONAL CARE ITEMS) ×3 IMPLANT
SOL .9 NS 3000ML IRR  AL (IV SOLUTION) ×2
SOL .9 NS 3000ML IRR UROMATIC (IV SOLUTION) ×1 IMPLANT
STRAP SAFETY BODY (MISCELLANEOUS) ×3 IMPLANT
TOWEL OR 17X26 4PK STRL BLUE (TOWEL DISPOSABLE) ×3 IMPLANT
TUBING CONNECTING 10 (TUBING) ×2 IMPLANT
TUBING CONNECTING 10' (TUBING) ×1
TUBING HYSTEROSCOPY DOLPHIN (MISCELLANEOUS) ×3 IMPLANT

## 2017-05-17 NOTE — Anesthesia Procedure Notes (Signed)
Procedure Name: LMA Insertion Date/Time: 05/17/2017 9:38 AM Performed by: Hedda Slade Pre-anesthesia Checklist: Patient identified, Patient being monitored, Timeout performed, Emergency Drugs available and Suction available Patient Re-evaluated:Patient Re-evaluated prior to induction Oxygen Delivery Method: Circle system utilized Preoxygenation: Pre-oxygenation with 100% oxygen Induction Type: IV induction Ventilation: Mask ventilation without difficulty LMA: LMA inserted LMA Size: 3.5 Tube type: Oral Number of attempts: 1 Placement Confirmation: positive ETCO2 and breath sounds checked- equal and bilateral Tube secured with: Tape Dental Injury: Teeth and Oropharynx as per pre-operative assessment

## 2017-05-17 NOTE — Anesthesia Post-op Follow-up Note (Signed)
Anesthesia QCDR form completed.        

## 2017-05-17 NOTE — Discharge Instructions (Addendum)
General Gynecological Post-Operative Instructions You may expect to feel dizzy, weak, and drowsy for as long as 24 hours after receiving the medicine that made you sleep (anesthetic).  Do not drive a car, ride a bicycle, participate in physical activities, or take public transportation until you are done taking narcotic pain medicines or as directed by your doctor.  Do not drink alcohol or take tranquilizers.  Do not take medicine that has not been prescribed by your doctor.  Do not sign important papers or make important decisions while on narcotic pain medicines.  Have a responsible person with you.  CARE OF INCISION  Take showers instead of baths until your doctor gives you permission to take baths.  Avoid heavy lifting (more than 10 pounds/4.5 kilograms), pushing, or pulling.  Avoid activities that may risk injury to your surgical site.  No sexual intercourse or placement of anything in the vagina for 1 weeks or as instructed by your doctor. Only take prescription or over-the-counter medicines  for pain, discomfort, or fever as directed by your doctor. Do not take aspirin. It can make you bleed. Take medicines (antibiotics) that kill germs if they are prescribed for you.  Call the office or go to the ER if:  You feel sick to your stomach (nauseous) and you start to throw up (vomit).  You have trouble eating or drinking.  You have an oral temperature above 101.  You have constipation that is not helped by adjusting diet or increasing fluid intake. Pain medicines are a common cause of constipation.  You have any other concerns. SEEK IMMEDIATE MEDICAL CARE IF:  You have persistent dizziness.  You have difficulty breathing or a congested sounding (croupy) cough.  You have an oral temperature above 102.5, not controlled by medicine.  There is increasing pain or tenderness near or in the surgical site.    AMBULATORY SURGERY  DISCHARGE INSTRUCTIONS   1) The drugs that you were given will  stay in your system until tomorrow so for the next 24 hours you should not:  A) Drive an automobile B) Make any legal decisions C) Drink any alcoholic beverage   2) You may resume regular meals tomorrow.  Today it is better to start with liquids and gradually work up to solid foods.  You may eat anything you prefer, but it is better to start with liquids, then soup and crackers, and gradually work up to solid foods.   3) Please notify your doctor immediately if you have any unusual bleeding, trouble breathing, redness and pain at the surgery site, drainage, fever, or pain not relieved by medication.    4) Additional Instructions:        Please contact your physician with any problems or Same Day Surgery at [phone], Monday through Friday 6 am to 4 pm, or [place] at [facility] number at [phone].

## 2017-05-17 NOTE — Transfer of Care (Signed)
Immediate Anesthesia Transfer of Care Note  Patient: Lori Nguyen  Procedure(s) Performed: DILATATION AND CURETTAGE (N/A )  Patient Location: PACU  Anesthesia Type:General  Level of Consciousness: awake, alert  and oriented  Airway & Oxygen Therapy: Patient Spontanous Breathing and Patient connected to face mask oxygen  Post-op Assessment: Report given to RN and Post -op Vital signs reviewed and stable  Post vital signs: Reviewed and stable  Last Vitals:  Vitals:   05/17/17 0850 05/17/17 1012  BP: 124/68 124/71  Pulse: 69 85  Resp: 18 18  Temp: 36.8 C (!) 36.1 C  SpO2: 100% 100%    Last Pain:  Vitals:   05/17/17 1012  TempSrc: Temporal         Complications: No apparent anesthesia complications

## 2017-05-17 NOTE — Anesthesia Preprocedure Evaluation (Addendum)
Anesthesia Evaluation  Patient identified by MRN, date of birth, ID band Patient awake    Reviewed: Allergy & Precautions, NPO status , Patient's Chart, lab work & pertinent test results, reviewed documented beta blocker date and time   Airway Mallampati: III  TM Distance: >3 FB     Dental  (+) Chipped, Missing   Pulmonary           Cardiovascular hypertension, Pt. on medications + Peripheral Vascular Disease       Neuro/Psych    GI/Hepatic GERD  Controlled,  Endo/Other  diabetes, Type 2  Renal/GU Renal disease     Musculoskeletal  (+) Arthritis ,   Abdominal   Peds  Hematology  (+) anemia ,   Anesthesia Other Findings   Reproductive/Obstetrics                            Anesthesia Physical Anesthesia Plan  ASA: III  Anesthesia Plan: General   Post-op Pain Management:    Induction: Intravenous  PONV Risk Score and Plan:   Airway Management Planned: LMA  Additional Equipment:   Intra-op Plan:   Post-operative Plan:   Informed Consent: I have reviewed the patients History and Physical, chart, labs and discussed the procedure including the risks, benefits and alternatives for the proposed anesthesia with the patient or authorized representative who has indicated his/her understanding and acceptance.     Plan Discussed with: CRNA  Anesthesia Plan Comments:         Anesthesia Quick Evaluation

## 2017-05-17 NOTE — Anesthesia Postprocedure Evaluation (Signed)
Anesthesia Post Note  Patient: Lori Nguyen  Procedure(s) Performed: DILATATION AND CURETTAGE (N/A )  Patient location during evaluation: PACU Anesthesia Type: General Level of consciousness: awake and alert Pain management: pain level controlled Vital Signs Assessment: post-procedure vital signs reviewed and stable Respiratory status: spontaneous breathing, nonlabored ventilation, respiratory function stable and patient connected to nasal cannula oxygen Cardiovascular status: blood pressure returned to baseline and stable Postop Assessment: no apparent nausea or vomiting Anesthetic complications: no     Last Vitals:  Vitals:   05/17/17 1042 05/17/17 1050  BP: 129/73   Pulse: 75   Resp: 16   Temp:  (!) 36.4 C  SpO2: 99%     Last Pain:  Vitals:   05/17/17 1103  TempSrc:   PainSc: [place]

## 2017-05-17 NOTE — H&P (Signed)
History and Physical Interval Note:  05/17/2017 8:58 AM  Lori Nguyen  has presented today for surgery, with the diagnosis of POSTMENOPAUSAL BLEEDING,ENDOMETRIAL THICKENING  The various methods of treatment have been discussed with the patient and family. After consideration of risks, benefits and other options for treatment, the patient has consented to  Procedure(s): [place] (N/A) as a surgical intervention .  The patient's history has been reviewed, patient examined, no change in status, stable for surgery.  Pt has the following beta blocker history-  Not taking Beta Blocker.  I have reviewed the patient's chart and labs.  Questions were answered to the patient's satisfaction.     [name]

## 2017-05-17 NOTE — Op Note (Signed)
Operative Note  05/17/2017  PRE-OP DIAGNOSIS: Postmenopausal Bleeding, Endometrial Thickening   POST-OP DIAGNOSIS: Same, Cervical Stenosis   SURGEON: Barnett Applebaum, MD, FACOG  PROCEDURE: Procedure(s): DILATATION AND CURETTAGE  Attempted Hysteroscopy  ANESTHESIA: Choice   ESTIMATED BLOOD LOSS: Min  SPECIMENS: ECC   COMPLICATIONS: Uterine perforation  DISPOSITION: PACU - hemodynamically stable.  CONDITION: stable  FINDINGS: Exam under anesthesia revealed small, mobile  uterus with no masses and bilateral adnexa without masses or fullness. Cervical stenosis, unable to obtain adequate dilation for procedure.  Perforation of uterus on attempts, no bleeding noted.  PROCEDURE IN DETAIL: After informed consent was obtained, the patient was taken to the operating room where anesthesia was obtained without difficulty. The patient was positioned in the dorsal lithotomy position in [place]. The patient's bladder was catheterized with an in and out foley catheter. The patient was examined under anesthesia, with the above noted findings. The weighted speculum was placed inside the patient's vagina, and the the anterior lip of the cervix was seen and grasped with the tenaculum.  An endocervical curettage was performed using a kevorkian curette.  Dilation was attempted with lacrimal duct probes, Pratt dilators, and small hysteroscope in an effort to achieve adequate visualiztion of the endometrial cavity, without success.  Perforation through the myometrium is identified, without bleeding noted, and the procedure is discontinued.  No EMC is done as the cavity was not found.  Excellent hemostasis was noted, and all instruments were removed, with excellent hemostasis noted throughout. She was then taken out of dorsal lithotomy.  Discrepancy of fluid was 250 mL.  The patient tolerated the procedure well. Sponge, lap and needle counts were correct x2. The patient was taken to recovery room in  excellent condition.  Barnett Applebaum, MD, [facility], [facility] 05/17/2017  10:04 AM

## 2017-05-18 LAB — SURGICAL PATHOLOGY

## 2017-06-05 ENCOUNTER — Encounter: Payer: Self-pay | Admitting: Obstetrics & Gynecology

## 2017-06-05 ENCOUNTER — Ambulatory Visit (INDEPENDENT_AMBULATORY_CARE_PROVIDER_SITE_OTHER): Payer: 59 | Admitting: Obstetrics & Gynecology

## 2017-06-05 VITALS — BP 120/70 | HR 78 | Ht 60.0 in | Wt 174.0 lb

## 2017-06-05 DIAGNOSIS — N950 Postmenopausal bleeding: Secondary | ICD-10-CM

## 2017-06-05 NOTE — Progress Notes (Signed)
  Postoperative Follow-up Patient presents post op from [facility] for PMB, 2 weeks ago.  Subjective: Patient reports no pain or bleeding.  Unable to complete D&C due to cervical stenosis.    Objective: BP 120/70   Pulse 78   Ht 5' (1.524 m)   Wt 174 lb (78.9 kg)   BMI 33.98 kg/m  Physical Exam  Constitutional: She is oriented to person, place, and time. She appears well-developed and well-nourished. No distress.  Musculoskeletal: Normal range of motion.  Neurological: She is alert and oriented to person, place, and time.  Skin: Skin is warm and dry.  Psychiatric: She has a normal mood and affect.  Vitals reviewed.  Assessment: s/p :  attempted D&C for PMB  Plan: Patient has done well after surgery with no apparent complications.  Options for conservative monitoring vs definitive dx/tx w hysterectomy discussed. As bleeding has been minimal and ES 6 mm, it is reasonable to wait and follow up with diary of bleeding episodes and repeat US In 3 mos.  Pt understands cannot rule out cancer without a biopsy, and now only way to obtain bx is by hysterectomy since EMB and D&C both unattainable.  If bleeding increases in frequency, if patient needs more reassurance, or if US shows worsening of endometrial thickening, then would consider TLH BSO.    [name] 06/05/2017, 11:37 AM

## 2017-06-08 ENCOUNTER — Telehealth: Payer: Self-pay | Admitting: Obstetrics & Gynecology

## 2017-06-08 NOTE — Telephone Encounter (Signed)
Patient is aware of H&P and continued discussion at [facility] on 06/28/17 @ 8:20am w/ Dr. Kenton Kingfisher, Pre-admit Testing afterwards, and OR on 07/05/17. Patient is aware of insurance info. Ext given.

## 2017-06-08 NOTE — Telephone Encounter (Signed)
Surgery Booking Request Patient Full Name:   MRN: [id number]  DOB: Mar 18, 1956  Surgeon: [name], MD  Requested Surgery Date and Time: any Primary Diagnosis AND Code: Post Menopausal Bleeding Secondary Diagnosis and Code:  Surgical Procedure: TLH BSO L&D Notification: No Admission Status: same day surgery Length of Surgery: 1.5 hr Special Case Needs: No H&P: yes (date) Phone Interview???: no Interpreter: Language:  Medical Clearance: no Special Scheduling Instructions: no  Plz discuss scheduling with patient and arrange time for me to obtain consents and continue discussion.

## 2017-06-08 NOTE — Telephone Encounter (Signed)
#   [phone] °

## 2017-06-08 NOTE — Telephone Encounter (Signed)
Pt is calling today wanting to leave Dr. Kenton Kingfisher know she is ready to schedule her Hysterectomy. Please advise scheduling

## 2017-06-27 ENCOUNTER — Other Ambulatory Visit: Payer: 59

## 2017-06-28 ENCOUNTER — Other Ambulatory Visit: Payer: Self-pay

## 2017-06-28 ENCOUNTER — Encounter: Payer: Self-pay | Admitting: Obstetrics & Gynecology

## 2017-06-28 ENCOUNTER — Encounter
Admission: RE | Admit: 2017-06-28 | Discharge: 2017-06-28 | Disposition: A | Discharge status: Home / Self Care | Payer: 59 | Source: Ambulatory Visit | Attending: Obstetrics & Gynecology | Admitting: Obstetrics & Gynecology

## 2017-06-28 ENCOUNTER — Ambulatory Visit (INDEPENDENT_AMBULATORY_CARE_PROVIDER_SITE_OTHER): Payer: 59 | Admitting: Obstetrics & Gynecology

## 2017-06-28 VITALS — BP 128/80 | HR 82 | Ht 60.0 in | Wt 174.0 lb

## 2017-06-28 DIAGNOSIS — N950 Postmenopausal bleeding: Secondary | ICD-10-CM | POA: Insufficient documentation

## 2017-06-28 DIAGNOSIS — Z8632 Personal history of gestational diabetes: Secondary | ICD-10-CM | POA: Diagnosis not present

## 2017-06-28 DIAGNOSIS — Z79899 Other long term (current) drug therapy: Secondary | ICD-10-CM | POA: Diagnosis not present

## 2017-06-28 DIAGNOSIS — Z7982 Long term (current) use of aspirin: Secondary | ICD-10-CM | POA: Insufficient documentation

## 2017-06-28 DIAGNOSIS — Q613 Polycystic kidney, unspecified: Secondary | ICD-10-CM | POA: Diagnosis not present

## 2017-06-28 DIAGNOSIS — M1990 Unspecified osteoarthritis, unspecified site: Secondary | ICD-10-CM | POA: Insufficient documentation

## 2017-06-28 DIAGNOSIS — K219 Gastro-esophageal reflux disease without esophagitis: Secondary | ICD-10-CM | POA: Diagnosis not present

## 2017-06-28 DIAGNOSIS — R9389 Abnormal findings on diagnostic imaging of other specified body structures: Secondary | ICD-10-CM | POA: Diagnosis not present

## 2017-06-28 DIAGNOSIS — Z91040 Latex allergy status: Secondary | ICD-10-CM | POA: Insufficient documentation

## 2017-06-28 DIAGNOSIS — Z0183 Encounter for blood typing: Secondary | ICD-10-CM | POA: Diagnosis not present

## 2017-06-28 DIAGNOSIS — Z801 Family history of malignant neoplasm of trachea, bronchus and lung: Secondary | ICD-10-CM | POA: Diagnosis not present

## 2017-06-28 DIAGNOSIS — I10 Essential (primary) hypertension: Secondary | ICD-10-CM | POA: Diagnosis not present

## 2017-06-28 DIAGNOSIS — R7303 Prediabetes: Secondary | ICD-10-CM | POA: Diagnosis not present

## 2017-06-28 DIAGNOSIS — Z01812 Encounter for preprocedural laboratory examination: Secondary | ICD-10-CM | POA: Diagnosis not present

## 2017-06-28 DIAGNOSIS — Z8249 Family history of ischemic heart disease and other diseases of the circulatory system: Secondary | ICD-10-CM | POA: Diagnosis not present

## 2017-06-28 HISTORY — DX: Vitamin D deficiency, unspecified: E55.9

## 2017-06-28 LAB — BASIC METABOLIC PANEL
Anion gap: 9 (ref 5–15)
BUN: 19 mg/dL (ref 6–20)
CO2: 29 mmol/L (ref 22–32)
Calcium: 9.4 mg/dL (ref 8.9–10.3)
Chloride: 102 mmol/L (ref 101–111)
Creatinine, Ser: 0.86 mg/dL (ref 0.44–1.00)
GFR calc Af Amer: >60 mL/min (ref >60)
GFR calc non Af Amer: >60 mL/min (ref >60)
Glucose, Bld: 107 mg/dL — ABNORMAL HIGH (ref 65–99)
Potassium: 3.4 mmol/L — ABNORMAL LOW (ref 3.5–5.1)
Sodium: 140 mmol/L (ref 135–145)

## 2017-06-28 LAB — CBC
HCT: 36.7 % (ref 35.0–47.0)
Hemoglobin: 12.3 g/dL (ref 12.0–16.0)
MCH: 28.4 pg (ref 26.0–34.0)
MCHC: 33.4 g/dL (ref 32.0–36.0)
MCV: 85.2 fL (ref 80.0–100.0)
Platelets: 204 10*3/uL (ref 150–440)
RBC: 4.31 MIL/uL (ref 3.80–5.20)
RDW: 13.2 % (ref 11.5–14.5)
WBC: 6.9 10*3/uL (ref 3.6–11.0)

## 2017-06-28 LAB — PROTIME-INR
INR: 0.97
Prothrombin Time: 12.8 seconds (ref 11.4–15.2)

## 2017-06-28 LAB — TYPE AND SCREEN
ABO/RH(D): A POS
Antibody Screen: NEGATIVE

## 2017-06-28 LAB — APTT: aPTT: 26 seconds (ref 24–36)

## 2017-06-28 NOTE — Patient Instructions (Signed)

## 2017-06-28 NOTE — Pre-Procedure Instructions (Signed)
Incentive spirometer given to patient with verbal, demonstration and written instructions; acknowledged understanding.

## 2017-06-28 NOTE — Patient Instructions (Signed)
Your procedure is scheduled on: Thursday, July 05, 2017 Report to Same Day Surgery on the 2nd floor in the [facility]. To find out your arrival time, please call [phone] between 1PM - 3PM on: Wednesday, July 04, 2017  REMEMBER: Instructions that are not followed completely may result in serious medical risk, up to and including death; or upon the discretion of your surgeon and anesthesiologist your surgery may need to be rescheduled.  Do not eat food after midnight the night before your procedure.  No gum chewing or hard candies.  You may however, drink CLEAR liquids up to 2 hours before you are scheduled to arrive at the hospital for your procedure.  Do not drink clear liquids within 2 hours of the start of your surgery.  Clear liquids include: - water  - apple juice without pulp - clear gatorade - black coffee or tea (Do NOT add anything to the coffee or tea) Do NOT drink anything that is not on this list.  No Alcohol for 24 hours before or after surgery.  No Smoking including e-cigarettes for 24 hours prior to surgery. No chewable tobacco products for at least 6 hours prior to surgery. No nicotine patches on the day of surgery.  Notify your doctor if there is any change in your medical condition (cold, fever, infection).  Do not wear jewelry, make-up, hairpins, clips or nail polish.  Do not wear lotions, powders, or perfumes. You may wear deodorant.  Do not shave 48 hours prior to surgery.  Contacts and dentures may not be worn into surgery.  Do not bring valuables to the hospital. [facility] is not responsible for any belongings or valuables.   TAKE THESE MEDICATIONS THE MORNING OF SURGERY WITH A SIP OF WATER:  1.  Pantoprazole (PROTONIX) - take one tablet the night before surgery and one tablet the morning of surgery to help prevent nausea  Use CHG Soap as directed on instruction sheet.  Follow recommendations from PCP regarding stopping Aspirin. STOP  TODAY (06/28/17)  TODAY!! Stop Anti-inflammatories such as ETODOLAC (LODINE), Advil, Aleve, Ibuprofen, Motrin, Naproxen, Naprosyn, Goodie powder, or aspirin products. (May take Tylenol or Acetaminophen if needed.)  TODAY!! Stop ANY OVER THE COUNTER supplements until after surgery. (May continue Vitamin D.)  If you are being discharged the day of surgery, you will not be allowed to drive home. You will need someone to drive you home and stay with you that night.   If you are taking public transportation, you will need to have a responsible adult to with you.  Please call the number above if you have any questions about these instructions.

## 2017-06-28 NOTE — Progress Notes (Signed)
PRE-OPERATIVE HISTORY AND PHYSICAL EXAM  HPI:  Lori Nguyen is a 61 y.o. G1P1001 No LMP recorded. Patient is postmenopausal.; she is being admitted for surgery related to postmenopausal bleeding and endometrial thickening by ultrasound. Postmenopausal bleeding is off and on over the last year. Not heavy. [place] w some endometrial thickening. Unable to do office EMB or even D&C procedure due to cervical stenosis.  PMHx: Past Medical History:  Diagnosis Date  . Anemia   . Arthritis   . Diabetes mellitus without complication ([place])    gestational diabetes in 64  . GERD (gastroesophageal reflux disease)   . Hypertension   . Polycystic kidney   . Pre-diabetes   . Vaginal bleeding    Past Surgical History:  Procedure Laterality Date  . BREAST EXCISIONAL BIOPSY Left 2001   benign  . BREAST SURGERY Left    Breast Biopsy  . CESAREAN SECTION  1990  . COLONOSCOPY    . COLONOSCOPY WITH PROPOFOL N/A 05/03/2017   Procedure: COLONOSCOPY WITH PROPOFOL;  Surgeon: Jonathon Bellows, MD;  [facility] ENDOSCOPY;  Service: Gastroenterology;  Laterality: N/A;  . DILATION AND CURETTAGE OF UTERUS N/A 05/17/2017   Procedure: DILATATION AND CURETTAGE;  Surgeon: Gae Dry, MD;  Location: [facility] ORS;  Service: Gynecology;  Laterality: N/A;  . HIP SURGERY     history of R hip repair  . JOINT REPLACEMENT Right January 2016   BIL Hip Replacement, Dr. Rudene Christians  . TONSILLECTOMY    . TOTAL HIP ARTHROPLASTY Left 06/15/2015   Procedure: TOTAL HIP ARTHROPLASTY ANTERIOR APPROACH;  Surgeon: Hessie Knows, MD;  Location: [facility] ORS;  Service: Orthopedics;  Laterality: Left;  . TUBAL LIGATION     Family History  Problem Relation Age of Onset  . Hypertension Mother   . Lung cancer Father   . Lung cancer Sister   . Breast cancer Neg Hx    Social History   Tobacco Use  . Smoking status: Never Smoker  . Smokeless tobacco: Never Used  Substance Use Topics  . Alcohol use: No  . Drug use: No    Current  Outpatient Medications:  .  aspirin EC 81 MG tablet, Take 81 mg by mouth daily., Disp: , Rfl:  .  cephALEXin (KEFLEX) 500 MG capsule, Take 2,000 mg by mouth See admin instructions. TAKE 4 CAPSULES (2000 MG) 1 HOUR PRIOR TO DENTAL PROCEDURES (HIP REPLACEMENT), Disp: , Rfl:  .  Cholecalciferol (VITAMIN D3 PO), Take 1 tablet by mouth daily., Disp: , Rfl:  .  etodolac (LODINE) 400 MG tablet, Take 400 mg by mouth 2 (two) times daily as needed (for pain.). , Disp: , Rfl:  .  ferrous sulfate 325 (65 FE) MG tablet, Take 325 mg by mouth daily with breakfast., Disp: , Rfl:  .  losartan-hydrochlorothiazide (HYZAAR) 100-25 MG tablet, Take 1 tablet by mouth daily., Disp: , Rfl:  .  oxyCODONE-acetaminophen (PERCOCET) 5-325 MG tablet, Take 1 tablet by mouth every 4 (four) hours as needed for moderate pain or severe pain. (Patient not taking: Reported on 06/05/2017), Disp: 24 tablet, Rfl: 0 .  pantoprazole (PROTONIX) 40 MG tablet, Take 40 mg by mouth daily. , Disp: , Rfl:  .  potassium chloride (K-DUR) 10 MEQ tablet, Take 10 mEq by mouth daily. , Disp: , Rfl:  Allergies: Latex  Review of Systems  Constitutional: Negative for chills, fever and malaise/fatigue.  HENT: Negative for congestion, sinus pain and sore throat.   Eyes: Negative for blurred vision  and pain.  Respiratory: Negative for cough and wheezing.   Cardiovascular: Negative for chest pain and leg swelling.  Gastrointestinal: Negative for abdominal pain, constipation, diarrhea, heartburn, nausea and vomiting.  Genitourinary: Negative for dysuria, frequency, hematuria and urgency.  Musculoskeletal: Negative for back pain, joint pain, myalgias and neck pain.  Skin: Negative for itching and rash.  Neurological: Negative for dizziness, tremors and weakness.  Endo/Heme/Allergies: Does not bruise/bleed easily.  Psychiatric/Behavioral: Negative for depression. The patient is not nervous/anxious and does not have insomnia.    Objective: BP 128/80    Pulse 82   Ht 5' (1.524 m)   Wt 174 lb (78.9 kg)   BMI 33.98 kg/m   Filed Weights   06/28/17 0830  Weight: 174 lb (78.9 kg)   Physical Exam  Constitutional: She is oriented to person, place, and time. She appears well-developed and well-nourished. No distress.  Genitourinary: Rectum normal, vagina normal and uterus normal. Pelvic exam was performed with patient supine. There is no rash or lesion on the right labia. There is no rash or lesion on the left labia. Vagina exhibits no lesion. No bleeding in the vagina. Right adnexum does not display mass and does not display tenderness. Left adnexum does not display mass and does not display tenderness. Cervix does not exhibit motion tenderness, lesion, friability or polyp.   Uterus is mobile and midaxial. Uterus is not enlarged or exhibiting a mass.  HENT:  Head: Normocephalic and atraumatic. Head is without laceration.  Right Ear: Hearing normal.  Left Ear: Hearing normal.  Nose: No epistaxis.  No foreign bodies.  Mouth/Throat: Uvula is midline, oropharynx is clear and moist and mucous membranes are normal.  Eyes: Pupils are equal, round, and reactive to light.  Neck: Normal range of motion. Neck supple. No thyromegaly present.  Cardiovascular: Normal rate and regular rhythm. Exam reveals no gallop and no friction rub.  No murmur heard. Pulmonary/Chest: Effort normal and breath sounds normal. No respiratory distress. She has no wheezes. Right breast exhibits no mass, no skin change and no tenderness. Left breast exhibits no mass, no skin change and no tenderness.  Abdominal: Soft. Bowel sounds are normal. She exhibits no distension. There is no tenderness. There is no rebound.  Musculoskeletal: Normal range of motion.  Neurological: She is alert and oriented to person, place, and time. No cranial nerve deficit.  Skin: Skin is warm and dry.  Psychiatric: She has a normal mood and affect. Judgment normal.  Vitals  reviewed.   Assessment: 1. Post-menopausal bleeding   2. Endometrial thickening on ultrasound   Desires hysterectomy as cannot truly say uterus is normal without successful biopsy or D&C.  Removal of ovaries as has been menopausal for 8+ years and risks of ovarian cancer in the future if retained discussed.  I have had a careful discussion with this patient about all the options available and the risk/benefits of each. I have fully informed this patient that surgery may subject her to a variety of discomforts and risks: She understands that most patients have surgery with little difficulty, but problems can happen ranging from minor to fatal. These include nausea, vomiting, pain, bleeding, infection, poor healing, hernia, or formation of adhesions. Unexpected reactions may occur from any drug or anesthetic given. Unintended injury may occur to other pelvic or abdominal structures such as Fallopian tubes, ovaries, bladder, ureter (tube from kidney to bladder), or bowel. Nerves going from the pelvis to the legs may be injured. Any such injury may require immediate  or later additional surgery to correct the problem. Excessive blood loss requiring transfusion is very unlikely but possible. Dangerous blood clots may form in the legs or lungs. Physical and sexual activity will be restricted in varying degrees for an indeterminate period of time but most often 2-6 weeks.  Finally, she understands that it is impossible to list every possible undesirable effect and that the condition for which surgery is done is not always cured or significantly improved, and in rare cases may be even worse.Ample time was given to answer all questions.  Barnett Applebaum, MD, [facility], [facility] 06/28/2017  8:38 AM

## 2017-07-05 ENCOUNTER — Other Ambulatory Visit: Payer: Self-pay

## 2017-07-05 ENCOUNTER — Ambulatory Visit: Payer: 59

## 2017-07-05 ENCOUNTER — Ambulatory Visit
Admission: RE | Admit: 2017-07-05 | Discharge: 2017-07-05 | Disposition: A | Discharge status: Home / Self Care | Payer: 59 | Source: Ambulatory Visit | Attending: Obstetrics & Gynecology | Admitting: Obstetrics & Gynecology

## 2017-07-05 ENCOUNTER — Ambulatory Visit: Payer: 59 | Admitting: Anesthesiology

## 2017-07-05 ENCOUNTER — Encounter: Payer: Self-pay | Admitting: *Deleted

## 2017-07-05 ENCOUNTER — Encounter
Admission: RE | Disposition: A | Discharge status: Home / Self Care | Payer: Self-pay | Source: Ambulatory Visit | Attending: Obstetrics & Gynecology

## 2017-07-05 DIAGNOSIS — Z79899 Other long term (current) drug therapy: Secondary | ICD-10-CM | POA: Diagnosis not present

## 2017-07-05 DIAGNOSIS — I10 Essential (primary) hypertension: Secondary | ICD-10-CM | POA: Insufficient documentation

## 2017-07-05 DIAGNOSIS — N838 Other noninflammatory disorders of ovary, fallopian tube and broad ligament: Secondary | ICD-10-CM | POA: Diagnosis not present

## 2017-07-05 DIAGNOSIS — K219 Gastro-esophageal reflux disease without esophagitis: Secondary | ICD-10-CM | POA: Diagnosis not present

## 2017-07-05 DIAGNOSIS — D649 Anemia, unspecified: Secondary | ICD-10-CM | POA: Diagnosis not present

## 2017-07-05 DIAGNOSIS — N950 Postmenopausal bleeding: Secondary | ICD-10-CM | POA: Diagnosis not present

## 2017-07-05 DIAGNOSIS — N841 Polyp of cervix uteri: Secondary | ICD-10-CM | POA: Diagnosis not present

## 2017-07-05 DIAGNOSIS — D259 Leiomyoma of uterus, unspecified: Secondary | ICD-10-CM | POA: Diagnosis not present

## 2017-07-05 DIAGNOSIS — Z419 Encounter for procedure for purposes other than remedying health state, unspecified: Secondary | ICD-10-CM

## 2017-07-05 DIAGNOSIS — N800 Endometriosis of uterus: Secondary | ICD-10-CM | POA: Insufficient documentation

## 2017-07-05 DIAGNOSIS — K660 Peritoneal adhesions (postprocedural) (postinfection): Secondary | ICD-10-CM | POA: Diagnosis not present

## 2017-07-05 DIAGNOSIS — R9389 Abnormal findings on diagnostic imaging of other specified body structures: Secondary | ICD-10-CM | POA: Diagnosis present

## 2017-07-05 DIAGNOSIS — Z7982 Long term (current) use of aspirin: Secondary | ICD-10-CM | POA: Insufficient documentation

## 2017-07-05 DIAGNOSIS — Z96643 Presence of artificial hip joint, bilateral: Secondary | ICD-10-CM | POA: Insufficient documentation

## 2017-07-05 DIAGNOSIS — E1151 Type 2 diabetes mellitus with diabetic peripheral angiopathy without gangrene: Secondary | ICD-10-CM | POA: Insufficient documentation

## 2017-07-05 DIAGNOSIS — N840 Polyp of corpus uteri: Secondary | ICD-10-CM | POA: Insufficient documentation

## 2017-07-05 HISTORY — PX: TOTAL LAPAROSCOPIC HYSTERECTOMY WITH SALPINGECTOMY: SHX6742

## 2017-07-05 HISTORY — PX: CYSTOSCOPY: SHX5120

## 2017-07-05 LAB — GLUCOSE, CAPILLARY: Glucose-Capillary: 110 mg/dL — ABNORMAL HIGH (ref 65–99)

## 2017-07-05 SURGERY — HYSTERECTOMY, TOTAL, LAPAROSCOPIC, WITH SALPINGECTOMY
Anesthesia: General

## 2017-07-05 MED ORDER — ONDANSETRON HCL 4 MG/2ML IJ SOLN: 4.0000 mg | Freq: Once | INTRAMUSCULAR | Status: DC | PRN

## 2017-07-05 MED ORDER — LACTATED RINGERS IV SOLN
INTRAVENOUS | Status: DC | PRN
  Administered 2017-07-05 (×2): via INTRAVENOUS

## 2017-07-05 MED ORDER — ONDANSETRON HCL 4 MG/2ML IJ SOLN
INTRAMUSCULAR | Status: DC | PRN
  Administered 2017-07-05: 4 mg via INTRAVENOUS

## 2017-07-05 MED ORDER — PHENYLEPHRINE HCL 10 MG/ML IJ SOLN
INTRAMUSCULAR | Status: DC | PRN
Start: 2017-07-05 — End: 2017-07-05
  Administered 2017-07-05 (×5): 200 ug via INTRAVENOUS
  Administered 2017-07-05: 100 ug via INTRAVENOUS
  Administered 2017-07-05 (×3): 200 ug via INTRAVENOUS

## 2017-07-05 MED ORDER — CEFOXITIN SODIUM-DEXTROSE 2-2.2 GM-%(50ML) IV SOLR
INTRAVENOUS | Status: AC
  Filled 2017-07-05: qty 50

## 2017-07-05 MED ORDER — SOD CITRATE-CITRIC ACID 500-334 MG/5ML PO SOLN
30.0000 mL | ORAL | Status: DC
  Filled 2017-07-05: qty 30

## 2017-07-05 MED ORDER — LACTATED RINGERS IV SOLN: INTRAVENOUS | Status: DC

## 2017-07-05 MED ORDER — FENTANYL CITRATE (PF) 100 MCG/2ML IJ SOLN
INTRAMUSCULAR | Status: DC | PRN
  Administered 2017-07-05: 100 ug via INTRAVENOUS

## 2017-07-05 MED ORDER — FENTANYL CITRATE (PF) 100 MCG/2ML IJ SOLN
25.0000 ug | INTRAMUSCULAR | Status: DC | PRN
  Administered 2017-07-05 (×2): 25 ug via INTRAVENOUS

## 2017-07-05 MED ORDER — FENTANYL CITRATE (PF) 100 MCG/2ML IJ SOLN
INTRAMUSCULAR | Status: AC
  Administered 2017-07-05: 25 ug via INTRAVENOUS
  Filled 2017-07-05: qty 2

## 2017-07-05 MED ORDER — FENTANYL CITRATE (PF) 100 MCG/2ML IJ SOLN
INTRAMUSCULAR | Status: AC
  Filled 2017-07-05: qty 2

## 2017-07-05 MED ORDER — STERILE WATER FOR IRRIGATION IR SOLN
Status: DC | PRN
  Administered 2017-07-05: 60 mL via INTRAVESICAL

## 2017-07-05 MED ORDER — MIDAZOLAM HCL 2 MG/2ML IJ SOLN
INTRAMUSCULAR | Status: DC | PRN
  Administered 2017-07-05: 2 mg via INTRAVENOUS

## 2017-07-05 MED ORDER — BUPIVACAINE HCL (PF) 0.5 % IJ SOLN
INTRAMUSCULAR | Status: DC | PRN
  Administered 2017-07-05: 18 mL

## 2017-07-05 MED ORDER — LIDOCAINE HCL (CARDIAC) 20 MG/ML IV SOLN
INTRAVENOUS | Status: DC | PRN
Start: 2017-07-05 — End: 2017-07-05
  Administered 2017-07-05: 100 mg via INTRAVENOUS

## 2017-07-05 MED ORDER — CEFOXITIN SODIUM-DEXTROSE 2-2.2 GM-%(50ML) IV SOLR
2.0000 g | Freq: Once | INTRAVENOUS | Status: AC
  Administered 2017-07-05: 2 g via INTRAVENOUS

## 2017-07-05 MED ORDER — METHYLENE BLUE 0.5 % INJ SOLN
INTRAVENOUS | Status: AC
  Filled 2017-07-05: qty 10

## 2017-07-05 MED ORDER — BUPIVACAINE HCL (PF) 0.5 % IJ SOLN
INTRAMUSCULAR | Status: AC
  Filled 2017-07-05: qty 30

## 2017-07-05 MED ORDER — OXYCODONE-ACETAMINOPHEN 5-325 MG PO TABS
1.0000 | ORAL_TABLET | Freq: Once | ORAL | Status: AC
  Administered 2017-07-05: 1 via ORAL

## 2017-07-05 MED ORDER — ROCURONIUM BROMIDE 100 MG/10ML IV SOLN
INTRAVENOUS | Status: DC | PRN
  Administered 2017-07-05: 40 mg via INTRAVENOUS

## 2017-07-05 MED ORDER — KETOROLAC TROMETHAMINE 30 MG/ML IJ SOLN
INTRAMUSCULAR | Status: AC
  Administered 2017-07-05: 30 mg via INTRAVENOUS
  Filled 2017-07-05: qty 1

## 2017-07-05 MED ORDER — ONDANSETRON 4 MG PO TBDP
ORAL_TABLET | ORAL | Status: AC
  Filled 2017-07-05: qty 1

## 2017-07-05 MED ORDER — ONDANSETRON 4 MG PO TBDP
4.0000 mg | ORAL_TABLET | Freq: Once | ORAL | Status: AC
  Administered 2017-07-05: 4 mg via ORAL

## 2017-07-05 MED ORDER — PROPOFOL 10 MG/ML IV BOLUS
INTRAVENOUS | Status: DC | PRN
  Administered 2017-07-05: 150 mg via INTRAVENOUS

## 2017-07-05 MED ORDER — SUGAMMADEX SODIUM 200 MG/2ML IV SOLN
INTRAVENOUS | Status: DC | PRN
Start: 2017-07-05 — End: 2017-07-05
  Administered 2017-07-05: 200 mg via INTRAVENOUS

## 2017-07-05 MED ORDER — OXYCODONE-ACETAMINOPHEN 5-325 MG PO TABS
ORAL_TABLET | ORAL | Status: AC
  Administered 2017-07-05: 1 via ORAL
  Filled 2017-07-05: qty 1

## 2017-07-05 MED ORDER — OXYCODONE-ACETAMINOPHEN 5-325 MG PO TABS: 1.0000 | ORAL_TABLET | ORAL | 0 refills | Status: DC | PRN

## 2017-07-05 MED ORDER — KETOROLAC TROMETHAMINE 30 MG/ML IJ SOLN
30.0000 mg | Freq: Four times a day (QID) | INTRAMUSCULAR | Status: DC
  Administered 2017-07-05: 30 mg via INTRAVENOUS

## 2017-07-05 MED ORDER — MIDAZOLAM HCL 2 MG/2ML IJ SOLN
INTRAMUSCULAR | Status: AC
  Filled 2017-07-05: qty 2

## 2017-07-05 SURGICAL SUPPLY — 49 items
BAG URINE DRAINAGE (UROLOGICAL SUPPLIES) ×4 IMPLANT
BLADE SURG SZ11 CARB STEEL (BLADE) ×4 IMPLANT
CANISTER SUCT 1200ML W/VALVE (MISCELLANEOUS) ×4 IMPLANT
CATH FOLEY 2WAY  5CC 16FR (CATHETERS) ×2
CATH URTH 16FR FL 2W BLN LF (CATHETERS) ×2 IMPLANT
CHLORAPREP W/TINT 26ML (MISCELLANEOUS) ×4 IMPLANT
DEFOGGER SCOPE WARMER CLEARIFY (MISCELLANEOUS) ×4 IMPLANT
DERMABOND ADVANCED (GAUZE/BANDAGES/DRESSINGS) ×2
DERMABOND ADVANCED .7 DNX12 (GAUZE/BANDAGES/DRESSINGS) ×2 IMPLANT
DEVICE SUTURE ENDOST 10MM (ENDOMECHANICALS) ×4 IMPLANT
DRAPE CAMERA CLOSED 9X96 (DRAPES) ×4 IMPLANT
DRSG TEGADERM 2-3/8X2-3/4 SM (GAUZE/BANDAGES/DRESSINGS) IMPLANT
ENDOSTITCH 0 SINGLE 48 (SUTURE) IMPLANT
GAUZE SPONGE NON-WVN 2X2 STRL (MISCELLANEOUS) IMPLANT
GLOVE BIO SURGEON STRL SZ8 (GLOVE) ×20 IMPLANT
GLOVE INDICATOR 8.0 STRL GRN (GLOVE) ×4 IMPLANT
GOWN STRL REUS W/ TWL LRG LVL3 (GOWN DISPOSABLE) ×2 IMPLANT
GOWN STRL REUS W/ TWL XL LVL3 (GOWN DISPOSABLE) ×4 IMPLANT
GOWN STRL REUS W/TWL LRG LVL3 (GOWN DISPOSABLE) ×2
GOWN STRL REUS W/TWL XL LVL3 (GOWN DISPOSABLE) ×4
GRASPER SUT TROCAR 14GX15 (MISCELLANEOUS) ×4 IMPLANT
IRRIGATION STRYKERFLOW (MISCELLANEOUS) ×2 IMPLANT
IRRIGATOR STRYKERFLOW (MISCELLANEOUS) ×4
IV LACTATED RINGERS 1000ML (IV SOLUTION) ×4 IMPLANT
KIT PINK PAD W/HEAD ARE REST (MISCELLANEOUS) ×4
KIT PINK PAD W/HEAD ARM REST (MISCELLANEOUS) ×2 IMPLANT
KIT RM TURNOVER CYSTO AR (KITS) ×4 IMPLANT
LABEL OR SOLS (LABEL) ×4 IMPLANT
MANIPULATOR VCARE LG CRV RETR (MISCELLANEOUS) IMPLANT
MANIPULATOR VCARE SML CRV RETR (MISCELLANEOUS) ×4 IMPLANT
MANIPULATOR VCARE STD CRV RETR (MISCELLANEOUS) IMPLANT
NEEDLE VERESS 14GA 120MM (NEEDLE) ×4 IMPLANT
NS IRRIG 500ML POUR BTL (IV SOLUTION) ×4 IMPLANT
OCCLUDER COLPOPNEUMO (BALLOONS) ×4 IMPLANT
PACK GYN LAPAROSCOPIC (MISCELLANEOUS) ×4 IMPLANT
PAD OB MATERNITY 4.3X12.25 (PERSONAL CARE ITEMS) ×4 IMPLANT
PAD PREP 24X41 OB/GYN DISP (PERSONAL CARE ITEMS) ×4 IMPLANT
SCISSORS METZENBAUM CVD 33 (INSTRUMENTS) ×4 IMPLANT
SET CYSTO W/LG BORE CLAMP LF (SET/KITS/TRAYS/PACK) ×4 IMPLANT
SHEARS HARMONIC ACE PLUS 36CM (ENDOMECHANICALS) ×4 IMPLANT
SLEEVE ENDOPATH XCEL 5M (ENDOMECHANICALS) ×4 IMPLANT
SPONGE VERSALON 2X2 STRL (MISCELLANEOUS)
SUT ENDO VLOC 180-0-8IN (SUTURE) ×4 IMPLANT
SUT VIC AB 0 CT1 36 (SUTURE) ×4 IMPLANT
SYR 10ML LL (SYRINGE) ×4 IMPLANT
SYR 50ML LL SCALE MARK (SYRINGE) ×4 IMPLANT
TROCAR ENDO BLADELESS 11MM (ENDOMECHANICALS) ×4 IMPLANT
TROCAR XCEL NON-BLD 5MMX100MML (ENDOMECHANICALS) ×4 IMPLANT
TUBING INSUF HEATED (TUBING) ×4 IMPLANT

## 2017-07-05 NOTE — Anesthesia Post-op Follow-up Note (Signed)
Anesthesia QCDR form completed.        

## 2017-07-05 NOTE — Transfer of Care (Signed)
Immediate Anesthesia Transfer of Care Note  Patient: Lori Nguyen  Procedure(s) Performed: TOTAL LAPAROSCOPIC HYSTERECTOMY, BSO (N/A ) CYSTOSCOPY  Patient Location: PACU  Anesthesia Type:General  Level of Consciousness: awake  Airway & Oxygen Therapy: Patient Spontanous Breathing  Post-op Assessment: Report given to RN  Post vital signs: stable  Last Vitals:  Vitals:   07/05/17 0922 07/05/17 1455  BP: 138/75 125/62  Pulse: 86 79  Resp: 16 14  Temp: 36.4 C (!) 36.3 C  SpO2: 98% 100%    Last Pain:  Vitals:   07/05/17 1455  TempSrc:   PainSc: Asleep         Complications: No apparent anesthesia complications

## 2017-07-05 NOTE — Anesthesia Procedure Notes (Signed)
Procedure Name: Intubation Date/Time: 07/05/2017 12:52 PM Performed by: Leander Rams, CRNA Pre-anesthesia Checklist: Patient identified, Emergency Drugs available, Suction available and Patient being monitored Patient Re-evaluated:Patient Re-evaluated prior to induction Oxygen Delivery Method: Circle system utilized Preoxygenation: Pre-oxygenation with 100% oxygen Induction Type: IV induction Ventilation: Mask ventilation without difficulty Laryngoscope Size: Mac and 3 Grade View: Grade I Tube type: Oral Tube size: 7.0 mm Airway Equipment and Method: Stylet Placement Confirmation: ETT inserted through vocal cords under direct vision Secured at: 21 cm Tube secured with: Tape Dental Injury: Teeth and Oropharynx as per pre-operative assessment

## 2017-07-05 NOTE — H&P (Signed)
History and Physical Interval Note:  07/05/2017 9:30 AM  Lori Nguyen  has presented today for surgery, with the diagnosis of POST MENOPAUSAL BLEEDING and ENDOMETRIAL THICKENING. The various methods of treatment have been discussed with the patient and family. After consideration of risks, benefits and other options for treatment, the patient has consented to  Procedure(s): TOTAL LAPAROSCOPIC HYSTERECTOMY, BSO (N/A) and CYSTOSCOPY as a surgical intervention .  The patient's history has been reviewed, patient examined, no change in status, stable for surgery.  Pt has the following beta blocker history-  Not taking Beta Blocker.  I have reviewed the patient's chart and labs.  Questions were answered to the patient's satisfaction.    [name]

## 2017-07-05 NOTE — Discharge Instructions (Signed)
Total Laparoscopic Hysterectomy, Care After °Refer to this sheet in the next few weeks. These instructions provide you with information on caring for yourself after your procedure. Your health care provider may also give you more specific instructions. Your treatment has been planned according to current medical practices, but problems sometimes occur. Call your health care provider if you have any problems or questions after your procedure. °What can I expect after the procedure? °· Pain and bruising at the incision sites. You will be given pain medicine to control it. °· Menopausal symptoms such as hot flashes, night sweats, and insomnia if your ovaries were removed. °· Sore throat from the breathing tube that was inserted during surgery. °Follow these instructions at home: °· Only take over-the-counter or prescription medicines for pain, discomfort, or fever as directed by your health care provider. °· Do not take aspirin. It can cause bleeding. °· Do not drive when taking pain medicine. °· Follow your health care provider's advice regarding diet, exercise, lifting, driving, and general activities. °· Resume your usual diet as directed and allowed. °· Get plenty of rest and sleep. °· Do not douche, use tampons, or have sexual intercourse for at least 6 weeks, or until your health care provider gives you permission. °· Change your bandages (dressings) as directed by your health care provider. °· Monitor your temperature and notify your health care provider of a fever. °· Take showers instead of baths for 2-3 weeks. °· Do not drink alcohol until your health care provider gives you permission. °· If you develop constipation, you may take a mild laxative with your health care provider's permission. Bran foods may help with constipation problems. Drinking enough fluids to keep your urine clear or pale yellow may help as well. °· Try to have someone home with you for 1-2 weeks to help around the house. °· Keep all of  your follow-up appointments as directed by your health care provider. °Contact a health care provider if: °· You have swelling, redness, or increasing pain around your incision sites. °· You have pus coming from your incision. °· You notice a bad smell coming from your incision. °· Your incision breaks open. °· You feel dizzy or lightheaded. °· You have pain or bleeding when you urinate. °· You have persistent diarrhea. °· You have persistent nausea and vomiting. °· You have abnormal vaginal discharge. °· You have a rash. °· You have any type of abnormal reaction or develop an allergy to your medicine. °· You have poor pain control with your prescribed medicine. °Get help right away if: °· You have chest pain or shortness of breath. °· You have severe abdominal pain that is not relieved with pain medicine. °· You have pain or swelling in your legs. °This information is not intended to replace advice given to you by your health care provider. Make sure you discuss any questions you have with your health care provider. °Document Released: 05/07/2013 Document Revised: 12/23/2015 Document Reviewed: 02/04/2013 °Elsevier Interactive Patient Education © 2017 Elsevier [employer] ° °AMBULATORY SURGERY  °DISCHARGE INSTRUCTIONS ° ° °1) The drugs that you were given will stay in your system until tomorrow so for the next 24 hours you should not: ° °A) Drive an automobile °B) Make any legal decisions °C) Drink any alcoholic beverage ° ° °2) You may resume regular meals tomorrow.  Today it is better to start with liquids and gradually work up to solid foods. ° °You may eat anything you prefer, but it is better   to start with liquids, then soup and crackers, and gradually work up to solid foods. ° ° °3) Please notify your doctor immediately if you have any unusual bleeding, trouble breathing, redness and pain at the surgery site, drainage, fever, or pain not relieved by medication. ° ° ° °4) Additional Instructions: ° ° ° ° ° ° ° °Please  contact your physician with any problems or Same Day Surgery at [phone], Monday through Friday 6 am to 4 pm, or [place] [facility] [place] Main number at [phone]. ° °

## 2017-07-05 NOTE — Op Note (Signed)
Operative Report:  PRE-OP DIAGNOSIS: POST MENOPAUSAL BLEEDING   POST-OP DIAGNOSIS: POST MENOPAUSAL BLEEDING   PROCEDURE: Procedure(s): TOTAL LAPAROSCOPIC HYSTERECTOMY, BSO, CYSTOSCOPY  SURGEON: Barnett Applebaum, MD, FACOG  ASSISTANTGeorgianne Fick, MD   ANESTHESIA: General endotracheal anesthesia  ESTIMATED BLOOD LOSS: less than 50   SPECIMENS: Uterus, Tubes, Ovaries.  COMPLICATIONS: None  DISPOSITION: stable to PACU  FINDINGS: Intraabdominal adhesions were noted. There significant and abundant adhesions along the anterior abdominal wall and lower uterine segment and cervix.  Ovaries normal.  PROCEDURE:  The patient was taken to the OR where anesthesia was administed. She was prepped and draped in the normal sterile fashion in the dorsal lithotomy position in the Kiester stirrups. A time out was performed. A Graves speculum was inserted, the cervix was grasped with a single tooth tenaculum and the endometrial cavity was sounded. The cervix was progressively dilated to a size 18 [place] with [employer] dilators. A V-Care uterine manipulator was inserted in the usual fashion without incident. Gloves were changed and attention was turned to the abdomen.   An infraumbilical transverse 80mm skin incision was made with the scalpel after local anesthesia applied to the skin. A Veress-step needle was inserted in the usual fashion and confirmed using the hanging drop technique. A pneumoperitoneum was obtained by insufflation of CO2 (opening pressure of 77mmHg) to 72mmHg. A diagnostic laparoscopy was performed yielding the previously described findings. Attention was turned to the left lower quadrant where after visualization of the inferior epigastric vessels a 29mm skin incision was made with the scalpel. A 5 mm laparoscopic port was inserted. The same procedure was repeated in the right lower quadrant with a 69mm trocar. Attention was turned to the left aspect of the uterus, where after visualization of the ureter,  the round ligament was coagulated and transected using the 10mm Harmonic Scapel. The anterior and posterior leafs of the broad ligament were dissected off as the anterior one was coagulated and transected in a caudal direction towards the cuff of the uterine manipulator.  Attention was then turned to the left fallopian tube and ovary which was recognized by visualization of the fimbria. The infundibulopelvic ligament and its blood vessels were carefully coagulated and transected using the Harmonic scapel.  Attention was turned to the right aspect of the uterus where the same procedure was performed.  The vesicouterine reflection of the peritoneum was dissected with the harmonic scapel and the bladder flap was created bluntly.  The uterine vessels were coagulated and transected bilaterally using first bipolar cautery and then the harmonic scapel. A 360 degree, circumferential colpotomy was done to completely amputate the uterus with cervix and tubes. Once the specimen was amputated it was delivered through the vagina.   The colpotomy was repaired in a simple running fashion using a v-lock absorbable suture with an endo-stitch device.  Vaginal exam confirms complete closure.  The cavity was copiously irrigated. A survey of the pelvic cavity revealed adequate hemostasis and no injury to bowel, bladder, or ureter.   A diagnostic cystoscopy was performed using saline distension of bladder with no lesions or injuries noted.  Bilateral urine flow from each ureteral orifice is visualized.  At this point the procedure was finalized. All the instruments were removed from the patient's body. Gas was expelled and patient is leveled.  Incisions are closed with skin adhesive.    Patient goes to recovery room in stable condition.  All sponge, instrument, and needle counts are correct x2.     Barnett Applebaum, MD, Cherlynn June  [facility], [facility] 07/05/2017  2:34 PM

## 2017-07-05 NOTE — Anesthesia Preprocedure Evaluation (Signed)
Anesthesia Evaluation  Patient identified by MRN, date of birth, ID band Patient awake    Reviewed: Allergy & Precautions, H&P , NPO status , Patient's Chart, lab work & pertinent test results, reviewed documented beta blocker date and time   Airway Mallampati: III  TM Distance: <3 FB Neck ROM: full    Dental  (+) Teeth Intact   Pulmonary neg pulmonary ROS,    Pulmonary exam normal        Cardiovascular Exercise Tolerance: Good hypertension, On Medications + Peripheral Vascular Disease  negative cardio ROS Normal cardiovascular exam Rhythm:regular Rate:Normal     Neuro/Psych negative neurological ROS  negative psych ROS   GI/Hepatic negative GI ROS, Neg liver ROS, GERD  ,  Endo/Other  negative endocrine ROSdiabetes, Well Controlled, Type 2  Renal/GU Renal diseasenegative Renal ROS  negative genitourinary   Musculoskeletal   Abdominal   Peds  Hematology negative hematology ROS (+) anemia ,   Anesthesia Other Findings Past Medical History: No date: Anemia No date: Arthritis No date: Diabetes mellitus without complication (HCC)     Comment:  gestational diabetes in 1990 No date: GERD (gastroesophageal reflux disease) No date: Hypertension No date: Polycystic kidney No date: Pre-diabetes No date: Vaginal bleeding No date: Vitamin D deficiency Past Surgical History: 2001: BREAST EXCISIONAL BIOPSY; Left     Comment:  benign No date: BREAST SURGERY; Left     Comment:  Breast Biopsy 1990: CESAREAN SECTION No date: COLONOSCOPY 05/03/2017: COLONOSCOPY WITH PROPOFOL; N/A     Comment:  Procedure: COLONOSCOPY WITH PROPOFOL;  Surgeon: Jonathon Bellows, MD;  Location: [facility] ENDOSCOPY;  Service:               Gastroenterology;  Laterality: N/A; 05/17/2017: DILATION AND CURETTAGE OF UTERUS; N/A     Comment:  unable to be done due to cervical stenosis No date: HIP SURGERY     Comment:  history of R hip  repair January 2016: JOINT REPLACEMENT; Right     Comment:  BIL Hip Replacement, Dr. Rudene Christians No date: TONSILLECTOMY 06/15/2015: TOTAL HIP ARTHROPLASTY; Left     Comment:  Procedure: TOTAL HIP ARTHROPLASTY ANTERIOR APPROACH;                Surgeon: Hessie Knows, MD;  Location: [facility] ORS;  Service:              Orthopedics;  Laterality: Left; No date: TUBAL LIGATION BMI    Body Mass Index:  33.98 kg/m     Reproductive/Obstetrics negative OB ROS                             Anesthesia Physical Anesthesia Plan  ASA: II  Anesthesia Plan: General ETT   Post-op Pain Management:    Induction:   PONV Risk Score and Plan: 4 or greater  Airway Management Planned:   Additional Equipment:   Intra-op Plan:   Post-operative Plan:   Informed Consent: I have reviewed the patients History and Physical, chart, labs and discussed the procedure including the risks, benefits and alternatives for the proposed anesthesia with the patient or authorized representative who has indicated his/her understanding and acceptance.   Dental Advisory Given  Plan Discussed with: CRNA  Anesthesia Plan Comments:         Anesthesia Quick Evaluation

## 2017-07-06 ENCOUNTER — Encounter: Payer: Self-pay | Admitting: Obstetrics & Gynecology

## 2017-07-09 LAB — SURGICAL PATHOLOGY

## 2017-07-09 NOTE — Anesthesia Postprocedure Evaluation (Signed)
Anesthesia Post Note  Patient: Lori Nguyen  Procedure(s) Performed: TOTAL LAPAROSCOPIC HYSTERECTOMY, BSO (N/A ) CYSTOSCOPY  Patient location during evaluation: PACU Anesthesia Type: General Level of consciousness: awake and alert Pain management: pain level controlled Vital Signs Assessment: post-procedure vital signs reviewed and stable Respiratory status: spontaneous breathing, nonlabored ventilation, respiratory function stable and patient connected to nasal cannula oxygen Cardiovascular status: blood pressure returned to baseline and stable Postop Assessment: no apparent nausea or vomiting Anesthetic complications: no     Last Vitals:  Vitals:   07/05/17 1607 07/05/17 1643  BP: (!) 85/55 113/62  Pulse: 71 72  Resp: 16 14  Temp: 36.5 C   SpO2: 100% 96%    Last Pain:  Vitals:   07/06/17 0824  TempSrc:   PainSc: 3                  Molli Barrows

## 2017-07-12 ENCOUNTER — Telehealth: Payer: Self-pay

## 2017-07-12 NOTE — Telephone Encounter (Signed)
FMLA/DISABILITY forms (2) for [employer] filled out and given to [place] for processing.

## 2017-07-19 ENCOUNTER — Encounter: Payer: Self-pay | Admitting: Obstetrics & Gynecology

## 2017-07-19 ENCOUNTER — Ambulatory Visit (INDEPENDENT_AMBULATORY_CARE_PROVIDER_SITE_OTHER): Payer: 59 | Admitting: Obstetrics & Gynecology

## 2017-07-19 VITALS — BP 120/60 | HR 84 | Ht 60.0 in | Wt 174.0 lb

## 2017-07-19 DIAGNOSIS — N950 Postmenopausal bleeding: Secondary | ICD-10-CM

## 2017-07-19 DIAGNOSIS — R9389 Abnormal findings on diagnostic imaging of other specified body structures: Secondary | ICD-10-CM

## 2017-07-19 NOTE — Progress Notes (Signed)
  Postoperative Follow-up Patient presents post op from [facility] BSO  for Endometrial thickening and PMB, 2 weeks ago.  Subjective: Patient reports marked improvement in her preop symptoms. Eating a regular diet without difficulty. The patient is not having any pain.  Activity: normal activities of daily living. Patient reports vaginal sx's of None  Objective: BP 120/60   Pulse 84   Ht 5' (1.524 m)   Wt 174 lb (78.9 kg)   BMI 33.98 kg/m  Physical Exam  Constitutional: She is oriented to person, place, and time. She appears well-developed and well-nourished. No distress.  Cardiovascular: Normal rate.  Pulmonary/Chest: Effort normal.  Abdominal: Soft. She exhibits no distension. There is no tenderness.  Incision Healing Well   Musculoskeletal: Normal range of motion.  Neurological: She is alert and oriented to person, place, and time. No cranial nerve deficit.  Skin: Skin is warm and dry.  Psychiatric: She has a normal mood and affect.    Assessment: s/p :  TLH BSO stable  Plan: Patient has done well after surgery with no apparent complications.  I have discussed the post-operative course to date, and the expected progress moving forward.  The patient understands what complications to be concerned about.  I will see the patient in routine follow up, or sooner if needed.    Activity plan: No heavy lifting.Pelvic rest. Plans to return to work Monday.  Pathology d/w pt DIAGNOSIS:  A. UTERUS WITH CERVIX, BILATERAL FALLOPIAN TUBES AND OVARIES; TOTAL  HYSTERECTOMY WITH BILATERAL SALPINGO-OOPHORECTOMY:  - CERVIX WITH ENDOCERVICAL POLYP MEASURING 0.8 CM.  - PROLIFERATIVE ENDOMETRIUM WITH ENDOMETRIAL POLYP MEASURING 2.2 CM.  - MYOMETRIUM WITH ADENOMYOSIS AND INTRAMURAL LEIOMYOMATA.  - OVARIES WITH STROMAL HYPERPLASIA.  - UNREMARKABLE BILATERAL FALLOPIAN TUBES.  - SEPARATE FRAGMENT OF NODULAR ADIPOSE TISSUE.  - NEGATIVE FOR ATYPIA AND MALIGNANCY.   [name] 07/19/2017, 10:59  AM

## 2017-07-25 ENCOUNTER — Encounter: Payer: Self-pay | Admitting: Obstetrics & Gynecology

## 2017-07-25 ENCOUNTER — Ambulatory Visit (INDEPENDENT_AMBULATORY_CARE_PROVIDER_SITE_OTHER): Payer: 59 | Admitting: Obstetrics & Gynecology

## 2017-07-25 ENCOUNTER — Telehealth: Payer: Self-pay | Admitting: Obstetrics & Gynecology

## 2017-07-25 VITALS — BP 142/70 | HR 87 | Ht 60.0 in | Wt 175.0 lb

## 2017-07-25 DIAGNOSIS — R319 Hematuria, unspecified: Secondary | ICD-10-CM | POA: Diagnosis not present

## 2017-07-25 LAB — POCT URINALYSIS DIPSTICK
Bilirubin, UA: NEGATIVE
Blood, UA: POSITIVE
Glucose, UA: NEGATIVE
Ketones, UA: NEGATIVE
Nitrite, UA: NEGATIVE
Protein, UA: NEGATIVE
Spec Grav, UA: 1.02 (ref 1.010–1.025)
Urobilinogen, UA: 1 E.U./dL
pH, UA: 7.5 (ref 5.0–8.0)

## 2017-07-25 MED ORDER — SULFAMETHOXAZOLE-TRIMETHOPRIM 800-160 MG PO TABS: 1.0000 | ORAL_TABLET | Freq: Two times a day (BID) | ORAL | 1 refills | Status: DC

## 2017-07-25 NOTE — Telephone Encounter (Signed)
Pt is calling today with concerns of a lot of blood in her Urine. Please advise. No cancellations and first available appt is 07/30/17.

## 2017-07-25 NOTE — Patient Instructions (Signed)
Sulfamethoxazole; Trimethoprim, SMX-TMP tablets What is this medicine? SULFAMETHOXAZOLE; TRIMETHOPRIM or SMX-TMP (suhl fuh meth OK suh zohl; trye METH oh prim) is a combination of a sulfonamide antibiotic and a second antibiotic, trimethoprim. It is used to treat or prevent certain kinds of bacterial infections. It will not work for colds, flu, or other viral infections. This medicine may be used for other purposes; ask your health care provider or pharmacist if you have questions. COMMON BRAND NAME(S): Bacter-Aid DS, Bactrim, Bactrim DS, Septra, Septra DS What should I tell my health care provider before I take this medicine? They need to know if you have any of these conditions: -anemia -asthma -being treated with anticonvulsants -if you frequently drink alcohol containing drinks -kidney disease -liver disease -low level of folic acid or glucose-6-phosphate dehydrogenase -poor nutrition or malabsorption -porphyria -severe allergies -thyroid disorder -an unusual or allergic reaction to sulfamethoxazole, trimethoprim, sulfa drugs, other medicines, foods, dyes, or preservatives -pregnant or trying to get pregnant -breast-feeding How should I use this medicine? Take this medicine by mouth with a full glass of water. Follow the directions on the prescription label. Take your medicine at regular intervals. Do not take it more often than directed. Do not skip doses or stop your medicine early. Talk to your pediatrician regarding the use of this medicine in children. Special care may be needed. This medicine has been used in children as young as 2 months of age. Overdosage: If you think you have taken too much of this medicine contact a poison control center or emergency room at once. NOTE: This medicine is only for you. Do not share this medicine with others. What if I miss a dose? If you miss a dose, take it as soon as you can. If it is almost time for your next dose, take only that dose. Do  not take double or extra doses. What may interact with this medicine? Do not take this medicine with any of the following medications: -aminobenzoate potassium -dofetilide -metronidazole This medicine may also interact with the following medications: -ACE inhibitors like benazepril, enalapril, lisinopril, and ramipril -birth control pills -cyclosporine -digoxin -diuretics -indomethacin -medicines for diabetes -methenamine -methotrexate -phenytoin -potassium supplements -pyrimethamine -sulfinpyrazone -tricyclic antidepressants -warfarin This list may not describe all possible interactions. Give your health care provider a list of all the medicines, herbs, non-prescription drugs, or dietary supplements you use. Also tell them if you smoke, drink alcohol, or use illegal drugs. Some items may interact with your medicine. What should I watch for while using this medicine? Tell your doctor or health care professional if your symptoms do not improve. Drink several glasses of water a day to reduce the risk of kidney problems. Do not treat diarrhea with over the counter products. Contact your doctor if you have diarrhea that lasts more than 2 days or if it is severe and watery. This medicine can make you more sensitive to the sun. Keep out of the sun. If you cannot avoid being in the sun, wear protective clothing and use a sunscreen. Do not use sun lamps or tanning beds/booths. What side effects may I notice from receiving this medicine? Side effects that you should report to your doctor or health care professional as soon as possible: -allergic reactions like skin rash or hives, swelling of the face, lips, or tongue -breathing problems -fever or chills, sore throat -irregular heartbeat, chest pain -joint or muscle pain -pain or difficulty passing urine -red pinpoint spots on skin -redness, blistering, peeling or loosening of   the skin, including inside the mouth -unusual bleeding or  bruising -unusually weak or tired -yellowing of the eyes or skin Side effects that usually do not require medical attention (report to your doctor or health care professional if they continue or are bothersome): -diarrhea -dizziness -headache -loss of appetite -nausea, vomiting -nervousness This list may not describe all possible side effects. Call your doctor for medical advice about side effects. You may report side effects to FDA at 1-800-FDA-1088. Where should I keep my medicine? Keep out of the reach of children. Store at room temperature between 20 to 25 degrees C (68 to 77 degrees F). Protect from light. Throw away any unused medicine after the expiration date. NOTE: This sheet is a summary. It may not cover all possible information. If you have questions about this medicine, talk to your doctor, pharmacist, or health care provider.  2018 Elsevier/Gold Standard (2013-02-21 14:38:26)  

## 2017-07-25 NOTE — Telephone Encounter (Signed)
If she can come in by 430 then I will check urine today

## 2017-07-25 NOTE — Progress Notes (Signed)
Patient presents with new onset hematuria; she is s/p TLH BSO for Endometrial thickening and PMB, 3 weeks ago.  Subjective: Pt reports noting blood in urine thi afternoon; no prior hematuria since surgery.  Some right lower quadrant mild pain.  PMHx: She  has a past medical history of Anemia, Arthritis, Diabetes mellitus without complication ([place]), GERD (gastroesophageal reflux disease), Hypertension, Polycystic kidney, Pre-diabetes, Vaginal bleeding, and Vitamin D deficiency. Also,  has a past surgical history that includes Hip surgery; Tonsillectomy; Breast surgery (Left); Tubal ligation; Cesarean section (1990); Total hip arthroplasty (Left, 06/15/2015); Breast excisional biopsy (Left, 2001); Colonoscopy; Colonoscopy with propofol (N/A, 05/03/2017); Joint replacement (Right, January 2016); Dilation and curettage of uterus (N/A, 05/17/2017); Total laparoscopic hysterectomy with salpingectomy (N/A, 07/05/2017); and Cystoscopy (07/05/2017)., family history includes Hypertension in her mother; Lung cancer in her father and sister.,  reports that  has never smoked. she has never used smokeless tobacco. She reports that she does not drink alcohol or use drugs.  Current Outpatient Medications:  .  aspirin EC 81 MG tablet, Take 81 mg by mouth daily., Disp: , Rfl:  .  cephALEXin (KEFLEX) 500 MG capsule, Take 2,000 mg by mouth See admin instructions. TAKE 4 CAPSULES (2000 MG) 1 HOUR PRIOR TO DENTAL PROCEDURES (HIP REPLACEMENT), Disp: , Rfl:  .  Cholecalciferol (VITAMIN D3 PO), Take 1 tablet by mouth daily., Disp: , Rfl:  .  etodolac (LODINE) 400 MG tablet, Take 400 mg by mouth 2 (two) times daily as needed (for pain.). , Disp: , Rfl:  .  ferrous sulfate 325 (65 FE) MG tablet, Take 325 mg by mouth daily with breakfast., Disp: , Rfl:  .  losartan-hydrochlorothiazide (HYZAAR) 100-25 MG tablet, Take 1 tablet by mouth daily., Disp: , Rfl:  .  oxyCODONE-acetaminophen (PERCOCET) 5-325 MG tablet, Take 1 tablet by  mouth every 4 (four) hours as needed for moderate pain or severe pain. (Patient not taking: Reported on 07/19/2017), Disp: 42 tablet, Rfl: 0 .  pantoprazole (PROTONIX) 40 MG tablet, Take 40 mg by mouth daily. , Disp: , Rfl:  .  potassium chloride (K-DUR) 10 MEQ tablet, Take 10 mEq by mouth daily. , Disp: , Rfl:  .  Also, is allergic to latex..  Review of Systems  Constitutional: Negative for chills, fever and malaise/fatigue.  HENT: Negative for congestion, sinus pain and sore throat.   Eyes: Negative for blurred vision and pain.  Respiratory: Negative for cough and wheezing.   Cardiovascular: Negative for chest pain and leg swelling.  Gastrointestinal: Negative for abdominal pain, constipation, diarrhea, heartburn, nausea and vomiting.  Genitourinary: Negative for dysuria, frequency, hematuria and urgency.  Musculoskeletal: Negative for back pain, joint pain, myalgias and neck pain.  Skin: Negative for itching and rash.  Neurological: Negative for dizziness, tremors and weakness.  Endo/Heme/Allergies: Does not bruise/bleed easily.  Psychiatric/Behavioral: Negative for depression. The patient is not nervous/anxious and does not have insomnia.    Objective: BP (!) 142/70   Pulse 87   Ht 5' (1.524 m)   Wt 175 lb (79.4 kg)   BMI 34.18 kg/m  Physical Exam  Constitutional: She is oriented to person, place, and time. She appears well-developed and well-nourished. No distress.  Musculoskeletal: Normal range of motion.  Neurological: She is alert and oriented to person, place, and time.  Skin: Skin is warm and dry.  Psychiatric: She has a normal mood and affect.  Vitals reviewed.  Results for orders placed or performed in visit on 07/25/17  POCT urinalysis dipstick  Result Value Ref Range   Color, UA red    Clarity, UA dark    Glucose, UA neg    Bilirubin, UA neg    Ketones, UA neg    Spec Grav, UA 1.020 1.010 - 1.025   Blood, UA positive    pH, UA 7.5 5.0 - 8.0   Protein, UA neg     Urobilinogen, UA 1.0 0.2 or 1.0 E.U./dL   Nitrite, UA neg    Leukocytes, UA Moderate (2+) (A) Negative   Appearance blood    Odor     Assessment: s/p :  TLH BSO now w new onset hematuria to be assessed  Plan: ABX for UTI Cystogram for bladder test, as is recent from surgery [place] risk discussed as well  [name] 07/25/2017, 4:52 PM

## 2017-07-27 ENCOUNTER — Ambulatory Visit
Admission: RE | Admit: 2017-07-27 | Discharge: 2017-07-27 | Disposition: A | Discharge status: Home / Self Care | Payer: 59 | Source: Ambulatory Visit | Attending: Obstetrics & Gynecology | Admitting: Obstetrics & Gynecology

## 2017-07-27 DIAGNOSIS — R319 Hematuria, unspecified: Secondary | ICD-10-CM | POA: Diagnosis not present

## 2017-07-27 DIAGNOSIS — Z96643 Presence of artificial hip joint, bilateral: Secondary | ICD-10-CM | POA: Insufficient documentation

## 2017-07-27 DIAGNOSIS — Z90710 Acquired absence of both cervix and uterus: Secondary | ICD-10-CM | POA: Diagnosis not present

## 2017-07-27 MED ORDER — IOTHALAMATE MEGLUMINE 17.2 % UR SOLN
250.0000 mL | Freq: Once | URETHRAL | Status: AC | PRN
  Administered 2017-07-27: 250 mL via URETHRAL

## 2017-08-16 ENCOUNTER — Ambulatory Visit (INDEPENDENT_AMBULATORY_CARE_PROVIDER_SITE_OTHER): Payer: 59 | Admitting: Obstetrics & Gynecology

## 2017-08-16 ENCOUNTER — Encounter: Payer: Self-pay | Admitting: Obstetrics & Gynecology

## 2017-08-16 ENCOUNTER — Ambulatory Visit: Payer: Self-pay | Admitting: Urology

## 2017-08-16 VITALS — BP 120/70 | HR 79 | Ht 60.0 in | Wt 173.0 lb

## 2017-08-16 DIAGNOSIS — N950 Postmenopausal bleeding: Secondary | ICD-10-CM

## 2017-08-16 DIAGNOSIS — R9389 Abnormal findings on diagnostic imaging of other specified body structures: Secondary | ICD-10-CM

## 2017-08-16 NOTE — Progress Notes (Signed)
  Postoperative Follow-up Patient presents post op from [facility] BSO for Endometial thickening and polyps, 6 weeks ago. Images:   Path: DIAGNOSIS:  A. UTERUS WITH CERVIX, BILATERAL FALLOPIAN TUBES AND OVARIES; TOTAL  HYSTERECTOMY WITH BILATERAL SALPINGO-OOPHORECTOMY:  - CERVIX WITH ENDOCERVICAL POLYP MEASURING 0.8 CM.  - PROLIFERATIVE ENDOMETRIUM WITH ENDOMETRIAL POLYP MEASURING 2.2 CM.  - MYOMETRIUM WITH ADENOMYOSIS AND INTRAMURAL LEIOMYOMATA.  - OVARIES WITH STROMAL HYPERPLASIA.  - UNREMARKABLE BILATERAL FALLOPIAN TUBES.  - SEPARATE FRAGMENT OF NODULAR ADIPOSE TISSUE.  - NEGATIVE FOR ATYPIA AND MALIGNANCY.  Subjective: Patient reports marked improvement in her preop symptoms. Eating a regular diet without difficulty. The patient is not having any pain.  Activity: normal activities of daily living. Patient reports vaginal sx's of None  Objective: BP 120/70   Pulse 79   Ht 5' (1.524 m)   Wt 173 lb (78.5 kg)   BMI 33.79 kg/m  Physical Exam  Constitutional: She is oriented to person, place, and time. She appears well-developed and well-nourished. No distress.  Genitourinary: Rectum normal and vagina normal. Pelvic exam was performed with patient supine. There is no rash, tenderness or lesion on the right labia. There is no rash, tenderness or lesion on the left labia. No erythema or bleeding in the vagina. Right adnexum does not display mass and does not display tenderness. Left adnexum does not display mass and does not display tenderness.  Genitourinary Comments: Cervix and uterus absent. Vaginal cuff healing well.  Cardiovascular: Normal rate.  Pulmonary/Chest: Effort normal.  Abdominal: Soft. She exhibits no distension. There is no tenderness.  Incision healing well.  Musculoskeletal: Normal range of motion.  Neurological: She is alert and oriented to person, place, and time. No cranial nerve deficit.  Skin: Skin is warm and dry.  Psychiatric: She has a normal mood and affect.     Assessment: s/p :  [facility] BSO stable  Plan: Patient has done well after surgery with no apparent complications.  I have discussed the post-operative course to date, and the expected progress moving forward.  The patient understands what complications to be concerned about.  I will see the patient in routine follow up, or sooner if needed.    Activity plan: No restriction.  [name] 08/16/2017, 8:58 AM

## 2017-08-20 ENCOUNTER — Telehealth: Payer: Self-pay | Admitting: Obstetrics & Gynecology

## 2017-08-20 NOTE — Telephone Encounter (Signed)
Pt seen last week, was told if she needed Rx for allergic reaction to give [place] a call, she would like Rx sent in.  (pt was allergic to antibiotic, caused her to break out in her genital area).  Preferred pharmacy [facility] [place].

## 2017-08-21 MED ORDER — CLOTRIMAZOLE-BETAMETHASONE 1-0.05 % EX CREA: 1.0000 "application " | TOPICAL_CREAM | Freq: Two times a day (BID) | CUTANEOUS | 0 refills | Status: DC

## 2017-08-21 NOTE — Telephone Encounter (Signed)
Lotrisone for external use due to irritation rash or itching.  Rx sent in this am.  Also can use benadryl oral therapy as needed.

## 2017-08-21 NOTE — Telephone Encounter (Signed)
Pt aware.

## 2017-09-05 ENCOUNTER — Ambulatory Visit (INDEPENDENT_AMBULATORY_CARE_PROVIDER_SITE_OTHER): Payer: 59 | Admitting: Urology

## 2017-09-05 ENCOUNTER — Encounter: Payer: Self-pay | Admitting: Urology

## 2017-09-05 VITALS — BP 154/82 | HR 88 | Ht 60.0 in | Wt 171.4 lb

## 2017-09-05 DIAGNOSIS — Q614 Renal dysplasia: Secondary | ICD-10-CM | POA: Diagnosis not present

## 2017-09-05 DIAGNOSIS — R310 Gross hematuria: Secondary | ICD-10-CM

## 2017-09-05 LAB — URINALYSIS, COMPLETE
Bilirubin, UA: NEGATIVE
Glucose, UA: NEGATIVE
Ketones, UA: NEGATIVE
Nitrite, UA: NEGATIVE
Protein, UA: NEGATIVE
Specific Gravity, UA: 1.015 (ref 1.005–1.030)
Urobilinogen, Ur: 0.2 mg/dL (ref 0.2–1.0)
pH, UA: 5.5 (ref 5.0–7.5)

## 2017-09-05 LAB — MICROSCOPIC EXAMINATION: Bacteria, UA: NONE SEEN

## 2017-09-05 NOTE — Progress Notes (Signed)
09/05/2017 3:00 PM   Lori Nguyen Jun 14, 1956 [id number]  Referring provider: No referring provider defined for this encounter.  Chief Complaint  Patient presents with  . Hematuria    HPI: Lori Nguyen is a 62 year old female who presents for evaluation of hematuria.  She underwent a total lap scopic hysterectomy/BSO on 07/05/2017.  She had no postoperative problems however presented to her gynecologist on 12/26 with new onset total gross painless hematuria.  She states the blood last 2-3 days then resolved.  She had no pain or discomfort.  She did note left flank pain the day prior to the hematuria which resolved and the hematuria followed. A cystogram was ordered which showed no contrast extravasation.    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  A second opinion was obtained at [facility] and she was followed for a few years with serial MRIs.  Her last imaging was in 2013 with a CT scan which showed bilateral multicystic kidneys.  PMH: Past Medical History:  Diagnosis Date  . Anemia   . Arthritis   . Diabetes mellitus without complication ([place])    gestational diabetes in 36  . GERD (gastroesophageal reflux disease)   . Hypertension   . Polycystic kidney   . Pre-diabetes   . Vaginal bleeding   . Vitamin D deficiency     Surgical History: Past Surgical History:  Procedure Laterality Date  . BREAST EXCISIONAL BIOPSY Left 2001   benign  . BREAST SURGERY Left    Breast Biopsy  . CESAREAN SECTION  1990  . COLONOSCOPY    . COLONOSCOPY WITH PROPOFOL N/A 05/03/2017   Procedure: COLONOSCOPY WITH PROPOFOL;  Surgeon: Jonathon Bellows, MD;  Location: [facility] ENDOSCOPY;  Service: Gastroenterology;  Laterality: N/A;  . CYSTOSCOPY  07/05/2017   Procedure: CYSTOSCOPY;  Surgeon: Gae Dry, MD;  Location: [facility] ORS;  Service: Gynecology;;  . DILATION AND CURETTAGE OF UTERUS N/A 05/17/2017   unable to be done due to cervical stenosis  . HIP  SURGERY     history of R hip repair  . JOINT REPLACEMENT Right January 2016   BIL Hip Replacement, Dr. Rudene Christians  . TONSILLECTOMY    . TOTAL HIP ARTHROPLASTY Left 06/15/2015   Procedure: TOTAL HIP ARTHROPLASTY ANTERIOR APPROACH;  Surgeon: Hessie Knows, MD;  Location: [facility] ORS;  Service: Orthopedics;  Laterality: Left;  . TOTAL LAPAROSCOPIC HYSTERECTOMY WITH SALPINGECTOMY N/A 07/05/2017   Procedure: TOTAL LAPAROSCOPIC HYSTERECTOMY, BSO;  Surgeon: Gae Dry, MD;  Location: [facility] ORS;  Service: Gynecology;  Laterality: N/A;  . TUBAL LIGATION      Home Medications:  Allergies as of 09/05/2017      Reactions   Latex Hives, Swelling      Medication List        Accurate as of 09/05/17  3:00 PM. Always use your most recent med list.          aspirin EC 81 MG tablet Take 81 mg by mouth daily.   cephALEXin 500 MG capsule Commonly known as:  KEFLEX Take 2,000 mg by mouth See admin instructions. TAKE 4 CAPSULES (2000 MG) 1 HOUR PRIOR TO DENTAL PROCEDURES (HIP REPLACEMENT)   clotrimazole-betamethasone cream Commonly known as:  LOTRISONE Apply 1 application topically 2 (two) times daily.   etodolac 400 MG tablet Commonly known as:  LODINE Take 400 mg by mouth 2 (two) times daily as needed (for pain.).   ferrous sulfate 325 (65 FE)  MG tablet Take 325 mg by mouth daily with breakfast.   losartan-hydrochlorothiazide 100-25 MG tablet Commonly known as:  HYZAAR Take 1 tablet by mouth daily.   pantoprazole 40 MG tablet Commonly known as:  PROTONIX Take 40 mg by mouth daily.   potassium chloride 10 MEQ tablet Commonly known as:  K-DUR Take 10 mEq by mouth daily.   VITAMIN D3 PO Take 1 tablet by mouth daily.       Allergies:  Allergies  Allergen Reactions  . Latex Hives and Swelling    Family History: Family History  Problem Relation Age of Onset  . Hypertension Mother   . Lung cancer Father   . Lung cancer Sister   . Breast cancer Neg Hx   . Bladder Cancer Neg Hx     . Kidney cancer Neg Hx     Social History:  reports that  has never smoked. she has never used smokeless tobacco. She reports that she does not drink alcohol or use drugs.  ROS: UROLOGY Frequent Urination?: No Hard to postpone urination?: No Burning/pain with urination?: No Get up at night to urinate?: Yes Leakage of urine?: No Urine stream starts and stops?: No Trouble starting stream?: No Do you have to strain to urinate?: No Blood in urine?: Yes Urinary tract infection?: No Sexually transmitted disease?: No Injury to kidneys or bladder?: No Painful intercourse?: No Weak stream?: No Currently pregnant?: No Vaginal bleeding?: No Last menstrual period?: n  Gastrointestinal Nausea?: No Vomiting?: No Indigestion/heartburn?: No Diarrhea?: No Constipation?: No  Constitutional Fever: No Night sweats?: No Weight loss?: No Fatigue?: No  Skin Skin rash/lesions?: No Itching?: No  Eyes Blurred vision?: No Double vision?: No  Ears/Nose/Throat Sore throat?: No Sinus problems?: No  Hematologic/Lymphatic Swollen glands?: No Easy bruising?: No  Cardiovascular Leg swelling?: No Chest pain?: No  Respiratory Cough?: No Shortness of breath?: No  Endocrine Excessive thirst?: No  Musculoskeletal Back pain?: No Joint pain?: Yes  Neurological Headaches?: No Dizziness?: No  Psychologic Depression?: No Anxiety?: No  Physical Exam: BP (!) 154/82 (BP Location: Right Arm, Patient Position: Sitting, Cuff Size: Normal)   Pulse 88   Ht 5' (1.524 m)   Wt 171 lb 6.4 oz (77.7 kg)   BMI 33.47 kg/m   Constitutional:  Alert and oriented, No acute distress. HEENT: [place] AT, moist mucus membranes.  Trachea midline, no masses. Cardiovascular: No clubbing, cyanosis, or edema. Respiratory: Normal respiratory effort, no increased work of breathing. GI: Abdomen is soft, nontender, nondistended, no abdominal masses GU: No CVA tenderness.  Skin: No rashes, bruises or  suspicious lesions. Lymph: No cervical or inguinal adenopathy. Neurologic: Grossly intact, no focal deficits, moving all 4 extremities. Psychiatric: Normal mood and affect.  Laboratory Data: Lab Results  Component Value Date   WBC 6.9 06/28/2017   HGB 12.3 06/28/2017   HCT 36.7 06/28/2017   MCV 85.2 06/28/2017   PLT 204 06/28/2017    Lab Results  Component Value Date   CREATININE 0.86 06/28/2017    Urinalysis Dipstick yellow, clear, 3+ blood, 1+ leukocytes Microscopy-11-30 RBC.  Assessment & Plan:   1. Gross hematuria 62 year old female with a history of multicystic kidneys and recent episode of total gross painless hematuria.  Her last renal imaging was in 2013.  I have recommended a full evaluation to include CT urogram and cystoscopy.  She is in agreement and desires to proceed.  - Urinalysis, Complete - CT HEMATURIA WORKUP; Future    Abbie Sons, MD  [facility] [address], [place], [place] [address] [phone]

## 2017-09-21 ENCOUNTER — Ambulatory Visit
Admission: RE | Admit: 2017-09-21 | Discharge: 2017-09-21 | Disposition: A | Discharge status: Home / Self Care | Payer: 59 | Source: Ambulatory Visit | Attending: Urology | Admitting: Urology

## 2017-09-21 DIAGNOSIS — Q612 Polycystic kidney, adult type: Secondary | ICD-10-CM | POA: Insufficient documentation

## 2017-09-21 DIAGNOSIS — R310 Gross hematuria: Secondary | ICD-10-CM | POA: Insufficient documentation

## 2017-09-21 DIAGNOSIS — K5730 Diverticulosis of large intestine without perforation or abscess without bleeding: Secondary | ICD-10-CM | POA: Insufficient documentation

## 2017-09-21 DIAGNOSIS — K429 Umbilical hernia without obstruction or gangrene: Secondary | ICD-10-CM | POA: Diagnosis not present

## 2017-09-21 LAB — POCT I-STAT CREATININE: Creatinine, Ser: 0.9 mg/dL (ref 0.44–1.00)

## 2017-09-21 MED ORDER — IOPAMIDOL (ISOVUE-300) INJECTION 61%
125.0000 mL | Freq: Once | INTRAVENOUS | Status: AC | PRN
  Administered 2017-09-21: 125 mL via INTRAVENOUS

## 2017-09-27 ENCOUNTER — Other Ambulatory Visit: Payer: 59 | Admitting: Urology

## 2018-10-07 IMAGING — CT CT ABD-PEL WO/W CM
3 of 12 series · 11 of 46 positions shown, 17 images · IV contrast (iopamidol)
Comparison: 12/27/2011 CT abdomen/pelvis.

CLINICAL DATA: Polycystic kidney disease. Episode of gross
hematuria. Prior cholecystectomy and hysterectomy.

EXAM:
CT ABDOMEN AND PELVIS WITHOUT AND WITH CONTRAST
TECHNIQUE: Multidetector CT imaging of the abdomen and pelvis was performed
following the standard protocol before and following the bolus
administration of intravenous contrast.
CONTRAST:  125mL [id number] IOPAMIDOL ([id number]) INJECTION 61%

[Series 3: without pre (person_name) · axial · non-contrast · 0.70mm/px · z∈[-1466,-1406]mm · 2 of 86 slices shown]
[im 13/86  soft-tissue]
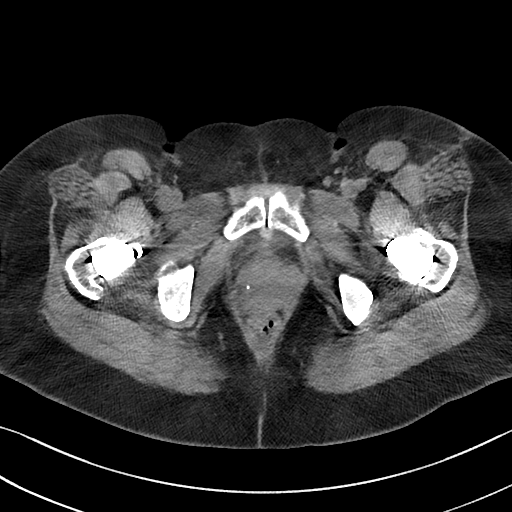
[im 25/86  soft-tissue]
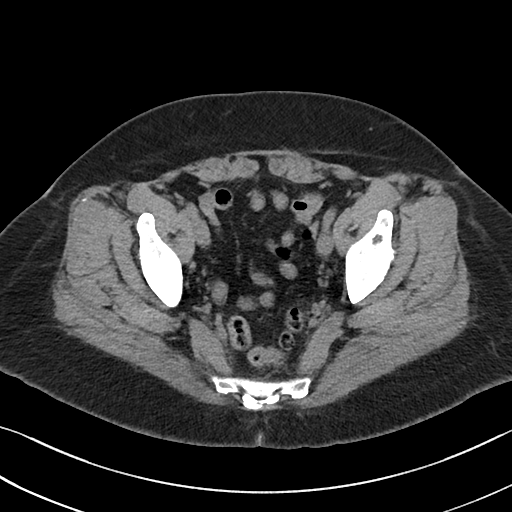

[Series 17: axial delay delay prone (person_name) · axial · delayed · 0.70mm/px · z∈[-1334,-984]mm · 7 of 94 slices shown, 12 images]
[im 12/94  soft-tissue]
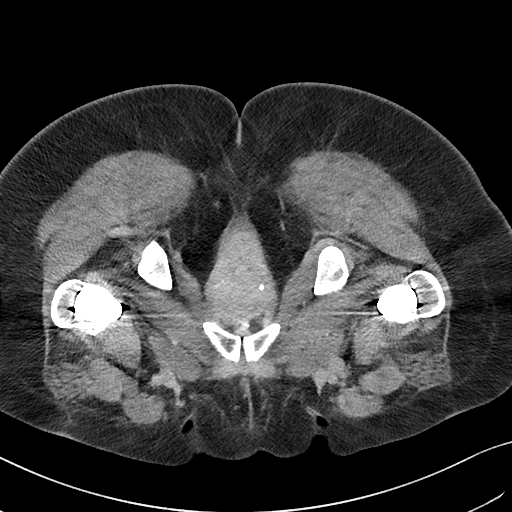
[im 12/94  bone]
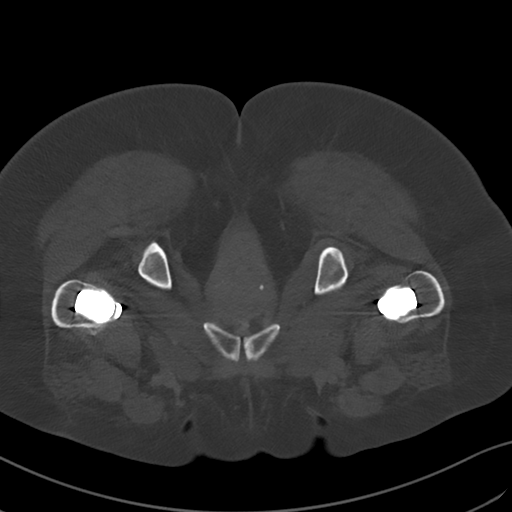
[im 24/94  soft-tissue]
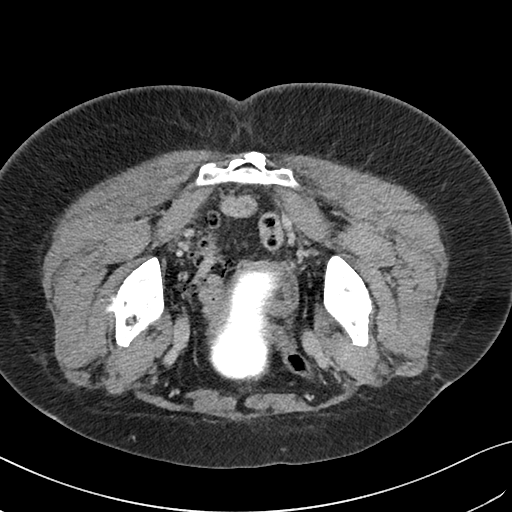
[im 35/94  soft-tissue]
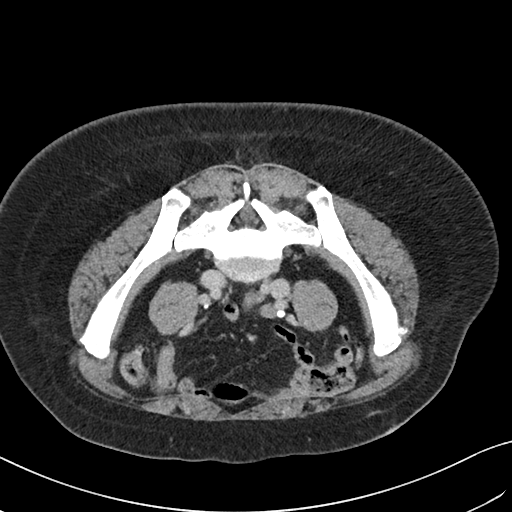
[im 47/94  soft-tissue]
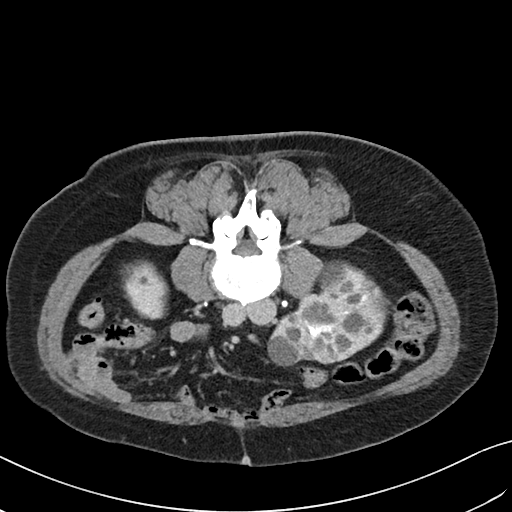
[im 47/94  lung]
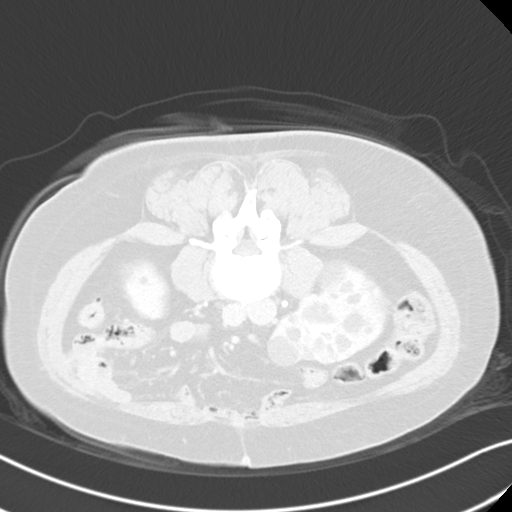
[im 59/94  soft-tissue]
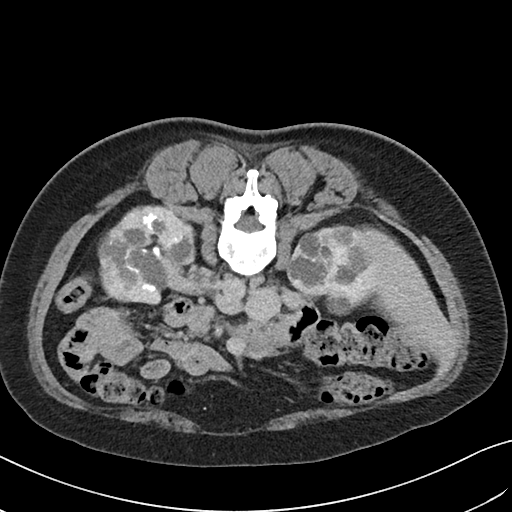
[im 59/94  lung]
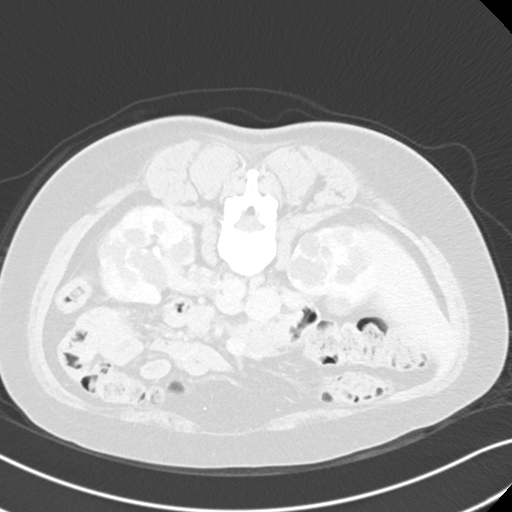
[im 70/94  soft-tissue]
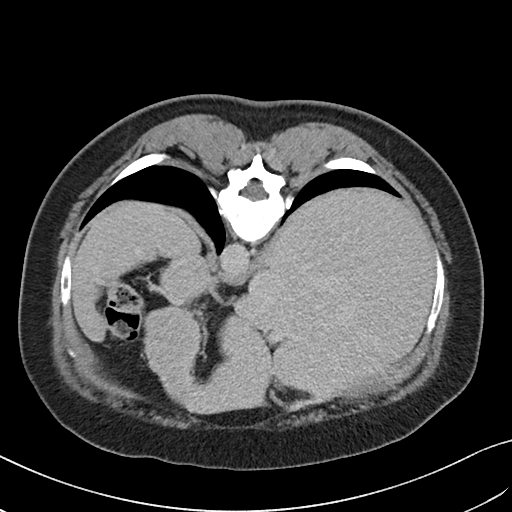
[im 70/94  lung]
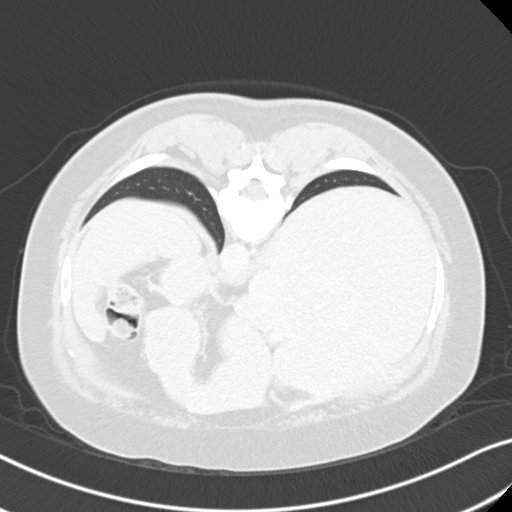
[im 82/94  soft-tissue]
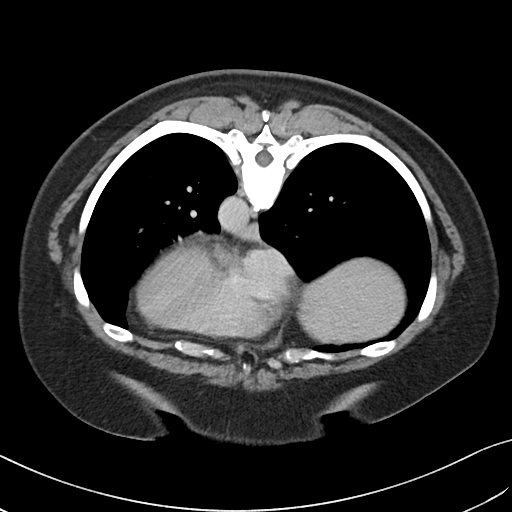
[im 82/94  lung]
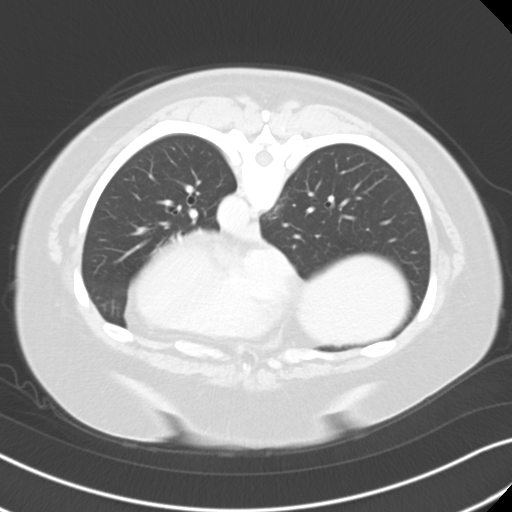

[Series 20: cor delay delay prone · coronal · delayed · 0.70mm/px · 2 of 176 slices shown, 3 images]
[im 59/176  soft-tissue]
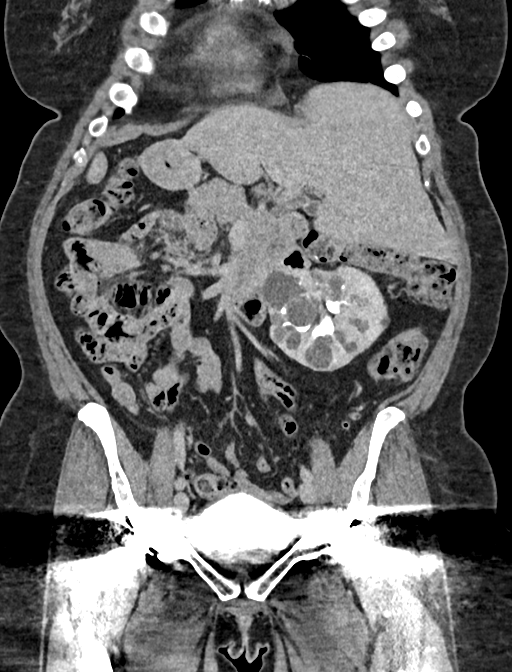
[im 59/176  bone]
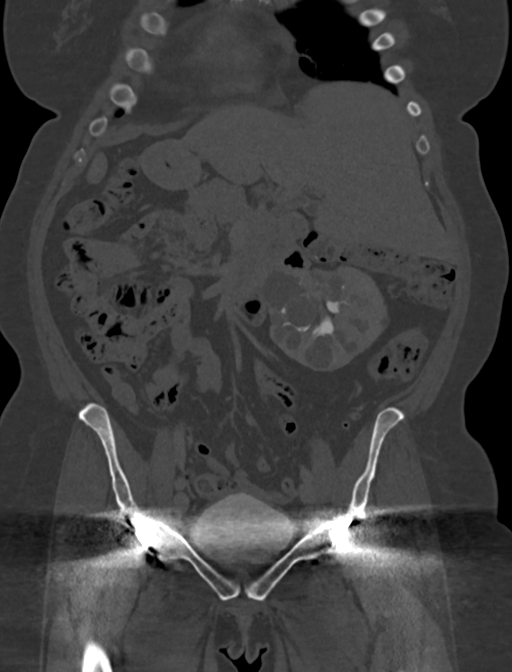
[im 117/176  soft-tissue]
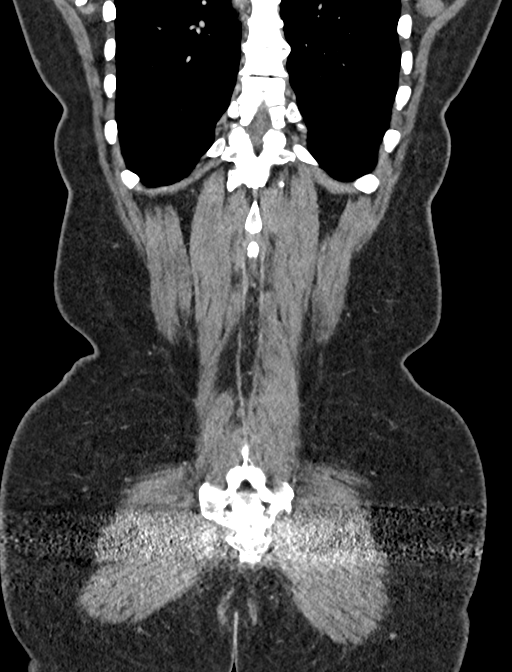

[11 of 46 positions shown; findings below may reference images not displayed]

FINDINGS: Lower chest: No significant pulmonary nodules or acute consolidative
airspace disease.

Hepatobiliary: Normal liver size. Simple 1.1 cm right liver cyst. No
additional liver lesions. Normal gallbladder with no radiopaque
cholelithiasis. No biliary ductal dilatation.

Pancreas: Normal, with no mass or duct dilation.

Spleen: Normal size. No mass.

Adrenals/Urinary Tract: Normal adrenals. No hydronephrosis. No
convincing renal stones. Normal caliber ureters, with no ureteral
stones, noting limited visualization of distal ureters due to metal
hip hardware artifact. The kidneys are symmetrically mildly to
moderately enlarged by innumerable renal cysts of varying
complexity. Several of the renal cysts demonstrate focal punctate,
coarse or curvilinear mural calcification. A 4.1 x 1.9 cm renal cyst
in the upper right kidney (series 17/image 38) demonstrates
eccentric hyperdense material without appreciable enhancement, most
compatible with a hemorrhagic/proteinaceous Bosniak category 2 renal
cyst. No solid or measurable septal enhancement is detected in any
of the renal cysts. A few small calyceal diverticula are noted in
both kidneys on the delayed sequence. On delayed imaging, there is
no urothelial wall thickening and there are no filling defects in
the opacified portions of the bilateral collecting systems or
ureters. Bladder visualization is limited by metal hip hardware
artifact. No appreciable bladder stones, wall thickening, masses or
diverticula.

Stomach/Bowel: Normal non-distended stomach. Small umbilical hernia
containing a small bowel loop, with no small bowel wall thickening,
pneumatosis, caliber transition or dilatation. Normal appendix. Mild
sigmoid diverticulosis, with no large bowel wall thickening or
pericolonic fat stranding.

Vascular/Lymphatic: Normal caliber abdominal aorta. Patent portal,
splenic, hepatic and renal veins. No pathologically enlarged lymph
nodes in the abdomen or pelvis.

Reproductive: Status post hysterectomy, with no gross abnormality at
the vaginal cuff, noting limited visualization due to metallic
hardware artifact. No adnexal mass.

Other: No pneumoperitoneum, ascites or focal fluid collection.

Musculoskeletal: No aggressive appearing focal osseous lesions.
Bilateral total hip arthroplasty. Mild thoracolumbar spondylosis.
Prominent lower lumbar facet arthropathy.
IMPRESSION: 1. No hydronephrosis.  No urolithiasis.
2. Polycystic kidneys with numerous complex renal cysts, none of
which demonstrate appreciable solid enhancement or other overtly
suspicious features.
3. No evidence of urothelial lesions.
4. Small umbilical hernia containing a small bowel loop. Mild
sigmoid diverticulosis. No bowel complication at this time.

## 2019-03-03 ENCOUNTER — Other Ambulatory Visit: Payer: Self-pay | Admitting: Family Medicine

## 2019-03-03 DIAGNOSIS — Z1231 Encounter for screening mammogram for malignant neoplasm of breast: Secondary | ICD-10-CM

## 2019-03-26 NOTE — Progress Notes (Signed)
PCP: Lorelee Market, MD   Chief Complaint  Patient presents with   Gynecologic Exam   [employer] Employee    HPI:      Ms. Lori Nguyen is a 63 y.o. No obstetric history on file. who LMP was No LMP recorded. Patient has had a hysterectomy., presents today for her annual examination.  Her menses are absent due to menopause. She is S/P TLHBSO for endometrial thickening/polyps with PMB 12/18 with Dr. Kenton Kingfisher. No longer having PMB/vaginal bleeding. No pain. She does not have vasomotor sx.   Sex activity: not sex active;  She does not have vaginal dryness.   Last Pap: October 08, 2015  Results were: no abnormalities /neg HPV DNA. Pap smear no longer indicated. Hx of STDs: none  Last mammogram: 03/28/17  Results were: normal--routine follow-up in 12 months. Has appt 9/20 There is no FH of breast cancer. There is no FH of ovarian cancer. The patient does do self-breast exams.  Colonoscopy: 10/18 with polyps; repeat due after 3 yrs at [facility]  Tobacco use: The patient denies current or previous tobacco use. Alcohol use: social Drug use: none Exercise: moderately active  She does get adequate calcium and Vitamin D in her diet.  Labs with PCP.   Past Medical History:  Diagnosis Date   Anemia    Arthritis    Diabetes mellitus without complication ([place])    gestational diabetes in 1990   GERD (gastroesophageal reflux disease)    Hypertension    Polycystic kidney    Pre-diabetes    Vaginal bleeding    Vitamin D deficiency     Past Surgical History:  Procedure Laterality Date   BREAST EXCISIONAL BIOPSY Left 2001   benign   BREAST SURGERY Left    Breast Biopsy   CESAREAN SECTION  1990   COLONOSCOPY     COLONOSCOPY WITH PROPOFOL N/A 05/03/2017   Procedure: COLONOSCOPY WITH PROPOFOL;  Surgeon: Jonathon Bellows, MD;  Location: [facility] ENDOSCOPY;  Service: Gastroenterology;  Laterality: N/A;   CYSTOSCOPY  07/05/2017   Procedure: CYSTOSCOPY;  Surgeon: Gae Dry, MD;  Location: [facility] ORS;  Service: Gynecology;;   DILATION AND CURETTAGE OF UTERUS N/A 05/17/2017   unable to be done due to cervical stenosis   HIP SURGERY     history of R hip repair   JOINT REPLACEMENT Right January 2016   BIL Hip Replacement, Dr. Rudene Christians   TONSILLECTOMY     TOTAL HIP ARTHROPLASTY Left 06/15/2015   Procedure: TOTAL HIP ARTHROPLASTY ANTERIOR APPROACH;  Surgeon: Hessie Knows, MD;  Location: [facility] ORS;  Service: Orthopedics;  Laterality: Left;   TOTAL LAPAROSCOPIC HYSTERECTOMY WITH SALPINGECTOMY N/A 07/05/2017   Procedure: TOTAL LAPAROSCOPIC HYSTERECTOMY, BSO;  Surgeon: Gae Dry, MD;  Location: [facility] ORS;  Service: Gynecology;  Laterality: N/A;   TUBAL LIGATION      Family History  Problem Relation Age of Onset   Hypertension Mother    Lung cancer Father    Lung cancer Sister    Breast cancer Neg Hx    Bladder Cancer Neg Hx    Kidney cancer Neg Hx     Social History   Socioeconomic History   Marital status: Married    Spouse name: Not on file   Number of children: Not on file   Years of education: Not on file   Highest education level: Not on file  Occupational History   Not on file  Social Needs   Financial resource strain: Not  on file   Food insecurity    Worry: Not on file    Inability: Not on file   Transportation needs    Medical: Not on file    Non-medical: Not on file  Tobacco Use   Smoking status: Never Smoker   Smokeless tobacco: Never Used  Substance and Sexual Activity   Alcohol use: No   Drug use: No   Sexual activity: Not Currently    Birth control/protection: Post-menopausal  Lifestyle   Physical activity    Days per week: Not on file    Minutes per session: Not on file   Stress: Not on file  Relationships   Social connections    Talks on phone: Not on file    Gets together: Not on file    Attends religious service: Not on file    Active member of club or organization: Not on file    Attends  meetings of clubs or organizations: Not on file    Relationship status: Not on file   Intimate partner violence    Fear of current or ex partner: Not on file    Emotionally abused: Not on file    Physically abused: Not on file    Forced sexual activity: Not on file  Other Topics Concern   Not on file  Social History Narrative   Not on file    Current Meds  Medication Sig   aspirin EC 81 MG tablet Take 81 mg by mouth daily.   Cholecalciferol (VITAMIN D3 PO) Take 1 tablet by mouth daily.   etodolac (LODINE) 400 MG tablet Take 400 mg by mouth 2 (two) times daily as needed (for pain.).    ferrous sulfate 325 (65 FE) MG tablet Take 325 mg by mouth daily with breakfast.   hydrochlorothiazide (HYDRODIURIL) 25 MG tablet    losartan (COZAAR) 100 MG tablet    pantoprazole (PROTONIX) 40 MG tablet Take 40 mg by mouth daily.    potassium chloride (K-DUR) 10 MEQ tablet Take 10 mEq by mouth daily.       ROS:  Review of Systems  Constitutional: Negative for fatigue, fever and unexpected weight change.  Respiratory: Negative for cough, shortness of breath and wheezing.   Cardiovascular: Negative for chest pain, palpitations and leg swelling.  Gastrointestinal: Negative for blood in stool, constipation, diarrhea, nausea and vomiting.  Endocrine: Negative for cold intolerance, heat intolerance and polyuria.  Genitourinary: Negative for dyspareunia, dysuria, flank pain, frequency, genital sores, hematuria, menstrual problem, pelvic pain, urgency, vaginal bleeding, vaginal discharge and vaginal pain.  Musculoskeletal: Positive for arthralgias and joint swelling. Negative for back pain and myalgias.  Skin: Positive for rash.  Neurological: Negative for dizziness, syncope, light-headedness, numbness and headaches.  Hematological: Negative for adenopathy.  Psychiatric/Behavioral: Negative for agitation, confusion, sleep disturbance and suicidal ideas. The patient is not nervous/anxious.        Objective: BP 140/80    Ht 5' (1.524 m)    Wt 159 lb 6.4 oz (72.3 kg)    BMI 31.13 kg/m    Physical Exam Constitutional:      Appearance: She is well-developed.  Genitourinary:     Vulva, vagina and left adnexa normal.     No vaginal discharge, erythema or tenderness.     Cervix is absent.     Uterus is absent.     No right or left adnexal mass present.     Right adnexa not tender.     Left adnexa not tender.  Genitourinary Comments: UTERUS/CX SURG REM  Neck:     Musculoskeletal: Normal range of motion.     Thyroid: No thyromegaly.  Cardiovascular:     Rate and Rhythm: Normal rate and regular rhythm.     Heart sounds: Normal heart sounds. No murmur.  Pulmonary:     Effort: Pulmonary effort is normal.     Breath sounds: Normal breath sounds.  Chest:     Breasts:        Right: No mass, nipple discharge, skin change or tenderness.        Left: No mass, nipple discharge, skin change or tenderness.  Abdominal:     Palpations: Abdomen is soft.     Tenderness: There is no abdominal tenderness. There is no guarding.  Musculoskeletal: Normal range of motion.  Neurological:     General: No focal deficit present.     Mental Status: She is alert and oriented to person, place, and time.     Cranial Nerves: No cranial nerve deficit.  Skin:    General: Skin is warm and dry.  Psychiatric:        Mood and Affect: Mood normal.        Behavior: Behavior normal.        Thought Content: Thought content normal.        Judgment: Judgment normal.  Vitals signs reviewed.     Assessment/Plan:  Encounter for annual routine gynecological examination  Screening for breast cancer - Pt has mammo sched. - Plan: MM 3D SCREEN BREAST BILATERAL         GYN counsel mammography screening, menopause, adequate intake of calcium and vitamin D, diet and exercise    F/U  Return in about 1 year (around 03/26/2020).  Mazikeen Hehn B. Zilah Villaflor, PA-C 03/27/2019 10:38 AM

## 2019-03-27 ENCOUNTER — Other Ambulatory Visit: Payer: Self-pay

## 2019-03-27 ENCOUNTER — Ambulatory Visit (INDEPENDENT_AMBULATORY_CARE_PROVIDER_SITE_OTHER): Payer: 59 | Admitting: Obstetrics and Gynecology

## 2019-03-27 ENCOUNTER — Encounter: Payer: Self-pay | Admitting: Obstetrics and Gynecology

## 2019-03-27 VITALS — BP 140/80 | Ht 60.0 in | Wt 159.4 lb

## 2019-03-27 DIAGNOSIS — Z01419 Encounter for gynecological examination (general) (routine) without abnormal findings: Secondary | ICD-10-CM

## 2019-03-27 DIAGNOSIS — Z1239 Encounter for other screening for malignant neoplasm of breast: Secondary | ICD-10-CM

## 2019-03-27 NOTE — Patient Instructions (Signed)
I value your feedback and entrusting us with your care. If you get a [facility] patient survey, I would appreciate you taking the time to let us know about your experience today. Thank you! 

## 2019-04-17 ENCOUNTER — Ambulatory Visit
Admission: RE | Admit: 2019-04-17 | Discharge: 2019-04-17 | Disposition: A | Discharge status: Home / Self Care | Payer: 59 | Source: Ambulatory Visit | Attending: Family Medicine | Admitting: Family Medicine

## 2019-04-17 DIAGNOSIS — Z1231 Encounter for screening mammogram for malignant neoplasm of breast: Secondary | ICD-10-CM | POA: Diagnosis present

## 2019-09-29 ENCOUNTER — Ambulatory Visit (INDEPENDENT_AMBULATORY_CARE_PROVIDER_SITE_OTHER): Payer: 59 | Admitting: Obstetrics and Gynecology

## 2019-09-29 ENCOUNTER — Other Ambulatory Visit: Payer: Self-pay

## 2019-09-29 ENCOUNTER — Encounter: Payer: Self-pay | Admitting: Obstetrics and Gynecology

## 2019-09-29 VITALS — BP 128/84 | Wt 164.0 lb

## 2019-09-29 DIAGNOSIS — N644 Mastodynia: Secondary | ICD-10-CM

## 2019-09-29 DIAGNOSIS — M79622 Pain in left upper arm: Secondary | ICD-10-CM | POA: Diagnosis not present

## 2019-09-29 NOTE — Progress Notes (Signed)
Lorelee Market, MD   Chief Complaint  Patient presents with  . Breast Pain    HPI:      Ms. Lori Nguyen is a 64 y.o. G1P1001 who LMP was No LMP recorded. Patient has had a hysterectomy., presents today for LT axillary discomfort, no masses. Sx started a couple wks ago after her 2nd covid shot in her LT arm. Soreness comes and goes and is becoming less frequent.  Drinks caffeine occas. Normally does SBE. No redness, irritaion, skin lesions.  Neg mammo 04/17/19. No FH breast/ovar cancer   Past Medical History:  Diagnosis Date  . Anemia   . Arthritis   . Diabetes mellitus without complication ([place])    gestational diabetes in 47  . GERD (gastroesophageal reflux disease)   . Hypertension   . Polycystic kidney   . Pre-diabetes   . Vaginal bleeding   . Vitamin D deficiency     Past Surgical History:  Procedure Laterality Date  . BREAST EXCISIONAL BIOPSY Left 2001   benign  . BREAST SURGERY Left    Breast Biopsy  . CESAREAN SECTION  1990  . COLONOSCOPY    . COLONOSCOPY WITH PROPOFOL N/A 05/03/2017   Procedure: COLONOSCOPY WITH PROPOFOL;  Surgeon: Jonathon Bellows, MD;  Location: [facility] ENDOSCOPY;  Service: Gastroenterology;  Laterality: N/A;  . CYSTOSCOPY  07/05/2017   Procedure: CYSTOSCOPY;  Surgeon: Gae Dry, MD;  Location: [facility] ORS;  Service: Gynecology;;  . DILATION AND CURETTAGE OF UTERUS N/A 05/17/2017   unable to be done due to cervical stenosis  . HIP SURGERY     history of R hip repair  . JOINT REPLACEMENT Right January 2016   BIL Hip Replacement, Dr. Rudene Christians  . TONSILLECTOMY    . TOTAL HIP ARTHROPLASTY Left 06/15/2015   Procedure: TOTAL HIP ARTHROPLASTY ANTERIOR APPROACH;  Surgeon: Hessie Knows, MD;  Location: [facility] ORS;  Service: Orthopedics;  Laterality: Left;  . TOTAL LAPAROSCOPIC HYSTERECTOMY WITH SALPINGECTOMY N/A 07/05/2017   Procedure: TOTAL LAPAROSCOPIC HYSTERECTOMY, BSO;  Surgeon: Gae Dry, MD;  Location: [facility] ORS;  Service: Gynecology;   Laterality: N/A;  . TUBAL LIGATION      Family History  Problem Relation Age of Onset  . Hypertension Mother   . Lung cancer Father   . Lung cancer Sister   . Breast cancer Neg Hx   . Bladder Cancer Neg Hx   . Kidney cancer Neg Hx     Social History   Socioeconomic History  . Marital status: Married    Spouse name: Not on file  . Number of children: Not on file  . Years of education: Not on file  . Highest education level: Not on file  Occupational History  . Not on file  Tobacco Use  . Smoking status: Never Smoker  . Smokeless tobacco: Never Used  Substance and Sexual Activity  . Alcohol use: No  . Drug use: No  . Sexual activity: Not Currently    Birth control/protection: Post-menopausal  Other Topics Concern  . Not on file  Social History Narrative  . Not on file   Social Determinants of Health   Financial Resource Strain:   . Difficulty of Paying Living Expenses: Not on file  Food Insecurity:   . Worried About Charity fundraiser in the Last Year: Not on file  . Ran Out of Food in the Last Year: Not on file  Transportation Needs:   . Lack of Transportation (Medical): Not on file  .  Lack of Transportation (Non-Medical): Not on file  Physical Activity:   . Days of Exercise per Week: Not on file  . Minutes of Exercise per Session: Not on file  Stress:   . Feeling of Stress : Not on file  Social Connections:   . Frequency of Communication with Friends and Family: Not on file  . Frequency of Social Gatherings with Friends and Family: Not on file  . Attends Religious Services: Not on file  . Active Member of Clubs or Organizations: Not on file  . Attends Archivist Meetings: Not on file  . Marital Status: Not on file  Intimate Partner Violence:   . Fear of Current or Ex-Partner: Not on file  . Emotionally Abused: Not on file  . Physically Abused: Not on file  . Sexually Abused: Not on file    Outpatient Medications Prior to Visit  Medication  Sig Dispense Refill  . aspirin EC 81 MG tablet Take 81 mg by mouth daily.    . Cholecalciferol (VITAMIN D3 PO) Take 1 tablet by mouth daily.    Marland Kitchen etodolac (LODINE) 400 MG tablet Take 400 mg by mouth 2 (two) times daily as needed (for pain.).     Marland Kitchen ferrous sulfate 325 (65 FE) MG tablet Take 325 mg by mouth daily with breakfast.    . hydrochlorothiazide (HYDRODIURIL) 25 MG tablet     . losartan (COZAAR) 100 MG tablet     . losartan-hydrochlorothiazide (HYZAAR) 100-25 MG tablet Take 1 tablet by mouth daily.    . pantoprazole (PROTONIX) 40 MG tablet Take 40 mg by mouth daily.     . potassium chloride (K-DUR) 10 MEQ tablet Take 10 mEq by mouth daily.      No facility-administered medications prior to visit.      ROS:  Review of Systems  Constitutional: Negative for fever.  Gastrointestinal: Negative for blood in stool, constipation, diarrhea, nausea and vomiting.  Genitourinary: Negative for dyspareunia, dysuria, flank pain, frequency, hematuria, urgency, vaginal bleeding, vaginal discharge and vaginal pain.  Musculoskeletal: Negative for back pain.  Skin: Negative for rash.   BREAST: tenderness   OBJECTIVE:   Vitals:  BP 128/84   Wt 164 lb (74.4 kg)   BMI 32.03 kg/m   Physical Exam Vitals reviewed.  Pulmonary:     Effort: Pulmonary effort is normal.  Chest:     Breasts: Breasts are symmetrical.        Right: No inverted nipple, mass, nipple discharge, skin change or tenderness.        Left: No inverted nipple, mass, nipple discharge, skin change or tenderness.     Comments: NEG LT AXILLARY EXAM Musculoskeletal:        General: Normal range of motion.     Cervical back: Normal range of motion.  Skin:    General: Skin is warm and dry.  Neurological:     General: No focal deficit present.     Mental Status: She is alert and oriented to person, place, and time.     Cranial Nerves: No cranial nerve deficit.  Psychiatric:        Mood and Affect: Mood normal.         Behavior: Behavior normal.        Thought Content: Thought content normal.        Judgment: Judgment normal.     Assessment/Plan: Axillary pain, left  Breast pain, left  Neg exam. Sx decreasing in frequency. Question LAN from covid vaccine.  Limit caffeine. F/u prn.     Return if symptoms worsen or fail to improve.  Aaliya Maultsby B. Indio Santilli, PA-C 09/29/2019 4:51 PM

## 2019-09-29 NOTE — Patient Instructions (Signed)
I value your feedback and entrusting us with your care. If you get a [facility] patient survey, I would appreciate you taking the time to let us know about your experience today. Thank you!  As of July 10, 2019, your lab results will be released to your MyChart immediately, before I even have a chance to see them. Please give me time to review them and contact you if there are any abnormalities. Thank you for your patience.  

## 2019-11-17 ENCOUNTER — Ambulatory Visit
Admission: EM | Admit: 2019-11-17 | Discharge: 2019-11-17 | Disposition: A | Discharge status: Home / Self Care | Payer: 59 | Attending: Emergency Medicine | Admitting: Emergency Medicine

## 2019-11-17 ENCOUNTER — Other Ambulatory Visit: Payer: Self-pay

## 2019-11-17 DIAGNOSIS — H81391 Other peripheral vertigo, right ear: Secondary | ICD-10-CM | POA: Diagnosis not present

## 2019-11-17 MED ORDER — ONDANSETRON 8 MG PO TBDP: ORAL_TABLET | ORAL | 0 refills | Status: DC

## 2019-11-17 MED ORDER — ONDANSETRON 8 MG PO TBDP
8.0000 mg | ORAL_TABLET | Freq: Once | ORAL | Status: AC
  Administered 2019-11-17: 15:00:00 8 mg via ORAL

## 2019-11-17 MED ORDER — MECLIZINE HCL 25 MG PO TABS: 25.0000 mg | ORAL_TABLET | Freq: Three times a day (TID) | ORAL | 0 refills | Status: AC | PRN

## 2019-11-17 NOTE — ED Provider Notes (Signed)
HPI  SUBJECTIVE:  Lori Nguyen is a 64 y.o. female who presents with dizziness described as intermittent vertigo accompanied with nausea and vomiting.  States that it lasts minutes and then resolves. denies vomiting at any other time, only when vertiginous.  States that her right ear feels clogged,  and reports decreased hearing.  She denies ear pain but reports pressure.  She reports a mild diffuse headache starting today, but thinks that this is due to not eating.  She reports decreased appetite and states that she is unable to tolerate p.o. today.  States that she has been unable to take her usual medications since her symptoms started yesterday.  No fevers, chest pain, palpitations, presyncope, syncope.  No arm or leg weakness, facial droop, slurred speech, abdominal pain.  No visual changes, tinnitus.  She has had symptoms like this before, but never sought medical attention.  She tried lying still with improvement in her symptoms.  Symptoms are worse with head rotation, large positional changes, turning over in bed.  She has a past medical history of diabetes, hypertension.  No history of atrial fibrillation, stroke.  RN:[id number], Sabino Gasser, MD   Past Medical History:  Diagnosis Date  . Anemia   . Arthritis   . Diabetes mellitus without complication ([place])    gestational diabetes in 69  . GERD (gastroesophageal reflux disease)   . Hypertension   . Polycystic kidney   . Pre-diabetes   . Vaginal bleeding   . Vitamin D deficiency     Past Surgical History:  Procedure Laterality Date  . BREAST EXCISIONAL BIOPSY Left 2001   benign  . BREAST SURGERY Left    Breast Biopsy  . CESAREAN SECTION  1990  . COLONOSCOPY    . COLONOSCOPY WITH PROPOFOL N/A 05/03/2017   Procedure: COLONOSCOPY WITH PROPOFOL;  Surgeon: Jonathon Bellows, MD;  Location: [facility] ENDOSCOPY;  Service: Gastroenterology;  Laterality: N/A;  . CYSTOSCOPY  07/05/2017   Procedure: CYSTOSCOPY;  Surgeon: Gae Dry, MD;   Location: [facility] ORS;  Service: Gynecology;;  . DILATION AND CURETTAGE OF UTERUS N/A 05/17/2017   unable to be done due to cervical stenosis  . HIP SURGERY     history of R hip repair  . JOINT REPLACEMENT Right January 2016   BIL Hip Replacement, Dr. Rudene Christians  . TONSILLECTOMY    . TOTAL HIP ARTHROPLASTY Left 06/15/2015   Procedure: TOTAL HIP ARTHROPLASTY ANTERIOR APPROACH;  Surgeon: Hessie Knows, MD;  Location: [facility] ORS;  Service: Orthopedics;  Laterality: Left;  . TOTAL LAPAROSCOPIC HYSTERECTOMY WITH SALPINGECTOMY N/A 07/05/2017   Procedure: TOTAL LAPAROSCOPIC HYSTERECTOMY, BSO;  Surgeon: Gae Dry, MD;  Location: [facility] ORS;  Service: Gynecology;  Laterality: N/A;  . TUBAL LIGATION      Family History  Problem Relation Age of Onset  . Hypertension Mother   . Lung cancer Father   . Lung cancer Sister   . Breast cancer Neg Hx   . Bladder Cancer Neg Hx   . Kidney cancer Neg Hx     Social History   Tobacco Use  . Smoking status: Never Smoker  . Smokeless tobacco: Never Used  Substance Use Topics  . Alcohol use: Yes    Comment: rare  . Drug use: No     Current Facility-Administered Medications:  .  ondansetron (ZOFRAN-ODT) disintegrating tablet 8 mg, 8 mg, Oral, Once, Melynda Ripple, MD  Current Outpatient Medications:  .  aspirin EC 81 MG tablet, Take 81 mg by mouth daily., Disp: ,  Rfl:  .  Cholecalciferol (VITAMIN D3 PO), Take 1 tablet by mouth daily., Disp: , Rfl:  .  etodolac (LODINE) 400 MG tablet, Take 400 mg by mouth 2 (two) times daily as needed (for pain.). , Disp: , Rfl:  .  ferrous sulfate 325 (65 FE) MG tablet, Take 325 mg by mouth daily with breakfast., Disp: , Rfl:  .  hydrochlorothiazide (HYDRODIURIL) 25 MG tablet, , Disp: , Rfl:  .  losartan (COZAAR) 100 MG tablet, , Disp: , Rfl:  .  losartan-hydrochlorothiazide (HYZAAR) 100-25 MG tablet, Take 1 tablet by mouth daily., Disp: , Rfl:  .  meclizine (ANTIVERT) 25 MG tablet, Take 1 tablet (25 mg total) by  mouth 3 (three) times daily as needed for dizziness., Disp: 30 tablet, Rfl: 0 .  ondansetron (ZOFRAN ODT) 8 MG disintegrating tablet, 1/2- 1 tablet q 8 hr prn nausea, vomiting, Disp: 20 tablet, Rfl: 0 .  pantoprazole (PROTONIX) 40 MG tablet, Take 40 mg by mouth daily. , Disp: , Rfl:  .  potassium chloride (K-DUR) 10 MEQ tablet, Take 10 mEq by mouth daily. , Disp: , Rfl:   Allergies  Allergen Reactions  . Latex Hives and Swelling  . Sulfa Antibiotics Rash  . Sulfur Rash    Bad rash everywhere     ROS  As noted in HPI.   Physical Exam  BP 138/66 (BP Location: Left Arm)   Pulse 71   Temp 98.1 F (36.7 C) (Oral)   Resp 18   Ht 4\' 11"  (1.499 m)   Wt 68 kg   SpO2 96%   BMI 30.30 kg/m   Constitutional: Well developed, well nourished, no acute distress Eyes:  EOMI, conjunctiva normal bilaterally PERRLA.  No nystagmus with looking straight ahead.  Positive horizontal nystagmus to the right HENT: Normocephalic, atraumatic,mucus membranes moist.  Bilateral TMs normal.  Right external ear normal.  Right EAC normal. Respiratory: Normal inspiratory effort Cardiovascular: Normal rate regular rhythm no murmurs rubs or gallops.  No carotid bruit GI: nondistended skin: No facial rash, skin intact Musculoskeletal: no deformities Neurologic: Alert & oriented x 3, cranial nerves III through XII intact.  Patient able to perform finger-nose, heel shin- Equal bilaterally.  Patient unable to perform tandem gait unassisted.  No extremity weakness.  Speech fluent.   Psychiatric: Speech and behavior appropriate   ED Course   Medications  ondansetron (ZOFRAN-ODT) disintegrating tablet 8 mg (has no administration in time range)    No orders of the defined types were placed in this encounter.   No results found for this or any previous visit (from the past 24 hour(s)). No results found.  ED Clinical Impression  1. Peripheral vertigo involving right ear      ED  Assessment/Plan  Performed the Epley maneuver on the right which provoked her symptoms.  She has fatigable horizontal nystagmus.    Presentation consistent with a peripheral vertigo.  Doubt central cause such as cerebellar stroke, vestibular migraine, hemorrhage.  With the ear fullness, could be Mnire's disease but think this is less likely.  Denies tinnitus.  No evidence of shingles.  No evidence of otitis.  Doubt vestibular neuritis.   Zofran 8 mg here.  Will send home with meclizine and Zofran.  Discussed Valium, but she opted to try the meclizine first. Epley maneuver on the right.    Follow-up with Dr. Richardson Landry, ENT on-call if no better in several days.  To the ER if she gets worse.  Discussed MDM, treatment plan,  and plan for follow-up with patient. Discussed sn/sx that should prompt return to the ED. patient agrees with plan.   Meds ordered this encounter  Medications  . meclizine (ANTIVERT) 25 MG tablet    Sig: Take 1 tablet (25 mg total) by mouth 3 (three) times daily as needed for dizziness.    Dispense:  30 tablet    Refill:  0  . ondansetron (ZOFRAN ODT) 8 MG disintegrating tablet    Sig: 1/2- 1 tablet q 8 hr prn nausea, vomiting    Dispense:  20 tablet    Refill:  0  . ondansetron (ZOFRAN-ODT) disintegrating tablet 8 mg    *This clinic note was created using [employer]. Therefore, there may be occasional mistakes despite careful proofreading.   ?    Melynda Ripple, MD 11/18/19 [phone]

## 2019-11-17 NOTE — ED Triage Notes (Signed)
Pt states she started yesterday with room-spinning dizziness and causes her to vomit. Worse with position changes

## 2019-11-17 NOTE — Discharge Instructions (Addendum)
Try the Zofran for nausea and vomiting and meclizine should help with a vertigo.  Do the Epley maneuver on the right.  You can also YouTube it to make sure that you are doing it correctly.  Epley maneuver times secures the vertigo.

## 2020-05-04 ENCOUNTER — Other Ambulatory Visit: Payer: Self-pay | Admitting: Obstetrics and Gynecology

## 2020-05-04 DIAGNOSIS — Z1231 Encounter for screening mammogram for malignant neoplasm of breast: Secondary | ICD-10-CM

## 2020-05-13 ENCOUNTER — Ambulatory Visit
Admission: RE | Admit: 2020-05-13 | Discharge: 2020-05-13 | Disposition: A | Discharge status: Home / Self Care | Payer: 59 | Source: Ambulatory Visit | Attending: Obstetrics and Gynecology | Admitting: Obstetrics and Gynecology

## 2020-05-13 ENCOUNTER — Other Ambulatory Visit: Payer: Self-pay

## 2020-05-13 DIAGNOSIS — Z1231 Encounter for screening mammogram for malignant neoplasm of breast: Secondary | ICD-10-CM | POA: Diagnosis not present

## 2020-05-14 ENCOUNTER — Telehealth (INDEPENDENT_AMBULATORY_CARE_PROVIDER_SITE_OTHER): Payer: Self-pay | Admitting: Gastroenterology

## 2020-05-14 DIAGNOSIS — Z86010 Personal history of colonic polyps: Secondary | ICD-10-CM

## 2020-05-14 MED ORDER — NA SULFATE-K SULFATE-MG SULF 17.5-3.13-1.6 GM/177ML PO SOLN: 1.0000 | Freq: Once | ORAL | 0 refills | Status: AC

## 2020-05-14 NOTE — Progress Notes (Signed)
Gastroenterology Pre-Procedure Review  Request Date: Tuesday Requesting Physician: Dr. Vicente Males  PATIENT REVIEW QUESTIONS: The patient responded to the following health history questions as indicated:    1. Are you having any GI issues? no 2. Do you have a personal history of Polyps? yes (05/03/17  colon polyps) 3. Do you have a family history of Colon Cancer or Polyps? no 4. Diabetes Mellitus? no 5. Joint replacements in the past 12 months?no 6. Major health problems in the past 3 months?no 7. Any artificial heart valves, MVP, or defibrillator?no    MEDICATIONS & ALLERGIES:    Patient reports the following regarding taking any anticoagulation/antiplatelet therapy:   Plavix, Coumadin, Eliquis, Xarelto, Lovenox, Pradaxa, Brilinta, or Effient? no Aspirin? yes ( 81 mg aspirin daily)  Patient confirms/reports the following medications:  Current Outpatient Medications  Medication Sig Dispense Refill  . aspirin EC 81 MG tablet Take 81 mg by mouth daily.    . Cholecalciferol (VITAMIN D3 PO) Take 1 tablet by mouth daily.    Marland Kitchen etodolac (LODINE) 400 MG tablet Take 400 mg by mouth 2 (two) times daily as needed (for pain.).     Marland Kitchen hydrochlorothiazide (HYDRODIURIL) 25 MG tablet     . losartan (COZAAR) 100 MG tablet     . meclizine (ANTIVERT) 25 MG tablet Take 1 tablet (25 mg total) by mouth 3 (three) times daily as needed for dizziness. 30 tablet 0  . ondansetron (ZOFRAN ODT) 8 MG disintegrating tablet 1/2- 1 tablet q 8 hr prn nausea, vomiting 20 tablet 0  . pantoprazole (PROTONIX) 40 MG tablet Take 40 mg by mouth daily.     . potassium chloride (K-DUR) 10 MEQ tablet Take 10 mEq by mouth daily.     . ferrous sulfate 325 (65 FE) MG tablet Take 325 mg by mouth daily with breakfast.    . Na Sulfate-K Sulfate-Mg Sulf 17.5-3.13-1.6 GM/177ML SOLN Take 1 kit by mouth once for 1 dose. 354 mL 0   No current facility-administered medications for this visit.    Patient confirms/reports the following  allergies:  Allergies  Allergen Reactions  . Latex Hives and Swelling  . Sulfa Antibiotics Rash  . Sulfur Rash    Bad rash everywhere    No orders of the defined types were placed in this encounter.   AUTHORIZATION INFORMATION Primary Insurance: 1D#: Group #:  Secondary Insurance: 1D#: Group #:  SCHEDULE INFORMATION: Date: Tuesday 06/15/20 Time: Location:[facility]

## 2020-05-24 ENCOUNTER — Encounter: Payer: Self-pay | Admitting: Physician Assistant

## 2020-05-24 ENCOUNTER — Other Ambulatory Visit: Payer: Self-pay

## 2020-05-24 ENCOUNTER — Ambulatory Visit: Payer: 59 | Admitting: Physician Assistant

## 2020-05-24 VITALS — BP 142/80 | HR 91 | Temp 98.9°F | Ht 60.0 in | Wt 151.0 lb

## 2020-05-24 DIAGNOSIS — Q613 Polycystic kidney, unspecified: Secondary | ICD-10-CM | POA: Diagnosis not present

## 2020-05-24 DIAGNOSIS — R310 Gross hematuria: Secondary | ICD-10-CM

## 2020-05-24 MED ORDER — OXYCODONE-ACETAMINOPHEN 5-325 MG PO TABS: 1.0000 | ORAL_TABLET | Freq: Four times a day (QID) | ORAL | 0 refills | Status: DC | PRN

## 2020-05-24 NOTE — Progress Notes (Signed)
05/24/2020 12:38 PM   Lori Nguyen 1955/08/09 [id number]  CC: Chief Complaint  Patient presents with  . Hematuria   HPI: Lori Nguyen is a 64 y.o. female with a history of polycystic kidney disease who presents today for evaluation of flank pain and gross hematuria.  She was seen in clinic most recently by Dr. Bernardo Heater on 09/05/2017 for evaluation of hematuria.  CT urogram was performed and patient was scheduled for follow-up cystoscopy, however she was subsequently lost to follow-up.  Today she reports a 5-day history of right flank pain radiatingto the RLQ, nausea, vomiting, and chills.  She states the pain is worse with inspiration.  She denies fever and recurrent UTI.  She was first seen by her PCP for evaluation of these, who reported hematuria and no UTI on urine testing.  She has been taking Zofran and notes improvement of her nausea and vomiting with this.  CT urogram dated 09/21/2017 with polycystic kidneys with numerous bilateral complex renal cysts without enhancement.  No urolithiasis noted.  No known family history of polycystic kidney disease.  She is not under the care of any nephrologist and only sees urology for incidental flank pain.  She is on losartan and HCTZ for management of hypertension per her PCP.  In-office UA today positive for 3+ blood, trace protein, and trace leukocyte esterase; urine microscopy with 6-10 WBCs/HPF and >30 RBCs/HPF.   PMH: Past Medical History:  Diagnosis Date  . Anemia   . Arthritis   . Diabetes mellitus without complication ([place])    gestational diabetes in 24  . GERD (gastroesophageal reflux disease)   . Hypertension   . Polycystic kidney   . Pre-diabetes   . Vaginal bleeding   . Vitamin D deficiency     Surgical History: Past Surgical History:  Procedure Laterality Date  . BREAST EXCISIONAL BIOPSY Left 2001   benign  . BREAST SURGERY Left    Breast Biopsy  . CESAREAN SECTION  1990  . COLONOSCOPY    . COLONOSCOPY WITH  PROPOFOL N/A 05/03/2017   Procedure: COLONOSCOPY WITH PROPOFOL;  Surgeon: Jonathon Bellows, MD;  Location: [facility] ENDOSCOPY;  Service: Gastroenterology;  Laterality: N/A;  . CYSTOSCOPY  07/05/2017   Procedure: CYSTOSCOPY;  Surgeon: Gae Dry, MD;  Location: [facility] ORS;  Service: Gynecology;;  . DILATION AND CURETTAGE OF UTERUS N/A 05/17/2017   unable to be done due to cervical stenosis  . HIP SURGERY     history of R hip repair  . JOINT REPLACEMENT Right January 2016   BIL Hip Replacement, Dr. Rudene Christians  . TONSILLECTOMY    . TOTAL HIP ARTHROPLASTY Left 06/15/2015   Procedure: TOTAL HIP ARTHROPLASTY ANTERIOR APPROACH;  Surgeon: Hessie Knows, MD;  Location: [facility] ORS;  Service: Orthopedics;  Laterality: Left;  . TOTAL LAPAROSCOPIC HYSTERECTOMY WITH SALPINGECTOMY N/A 07/05/2017   Procedure: TOTAL LAPAROSCOPIC HYSTERECTOMY, BSO;  Surgeon: Gae Dry, MD;  Location: [facility] ORS;  Service: Gynecology;  Laterality: N/A;  . TUBAL LIGATION      Home Medications:  Allergies as of 05/24/2020      Reactions   Latex Hives, Swelling   Sulfa Antibiotics Rash   Sulfur Rash   Bad rash everywhere      Medication List       Accurate as of May 24, 2020 12:38 PM. If you have any questions, ask your nurse or doctor.        aspirin EC 81 MG tablet Take 81 mg by mouth daily.  etodolac 400 MG tablet Commonly known as: LODINE Take 400 mg by mouth 2 (two) times daily as needed (for pain.).   ferrous sulfate 325 (65 FE) MG tablet Take 325 mg by mouth daily with breakfast.   hydrochlorothiazide 25 MG tablet Commonly known as: HYDRODIURIL   losartan 100 MG tablet Commonly known as: COZAAR   meclizine 25 MG tablet Commonly known as: ANTIVERT Take 1 tablet (25 mg total) by mouth 3 (three) times daily as needed for dizziness.   ondansetron 8 MG disintegrating tablet Commonly known as: Zofran ODT 1/2- 1 tablet q 8 hr prn nausea, vomiting   oxyCODONE-acetaminophen 5-325 MG tablet Commonly known  as: PERCOCET/ROXICET Take 1-2 tablets by mouth every 6 (six) hours as needed for severe pain. Started by: Debroah Loop, PA-C   pantoprazole 40 MG tablet Commonly known as: PROTONIX Take 40 mg by mouth daily.   potassium chloride 10 MEQ tablet Commonly known as: KLOR-CON Take 10 mEq by mouth daily.   VITAMIN D3 PO Take 1 tablet by mouth daily.      Allergies:  Allergies  Allergen Reactions  . Latex Hives and Swelling  . Sulfa Antibiotics Rash  . Sulfur Rash    Bad rash everywhere   Family History: Family History  Problem Relation Age of Onset  . Hypertension Mother   . Lung cancer Father   . Lung cancer Sister   . Breast cancer Neg Hx   . Bladder Cancer Neg Hx   . Kidney cancer Neg Hx    Social History:   reports that she has never smoked. She has never used smokeless tobacco. She reports current alcohol use. She reports that she does not use drugs.  Physical Exam: BP (!) 142/80   Pulse 91   Temp 98.9 F (37.2 C) (Oral)   Ht 5' (1.524 m)   Wt 151 lb (68.5 kg)   BMI 29.49 kg/m   Constitutional:  Alert and oriented, no acute distress, nontoxic appearing HEENT: [place], AT Cardiovascular: No clubbing, cyanosis, or edema Respiratory: Normal respiratory effort, no increased work of breathing Skin: No rashes, bruises or suspicious lesions Neurologic: Grossly intact, no focal deficits, moving all 4 extremities Psychiatric: Normal mood and affect  Laboratory Data: Results for orders placed or performed in visit on 05/24/20  Microscopic Examination   Urine  Result Value Ref Range   WBC, UA 6-10 (A) 0 - 5 /hpf   RBC >30 (A) 0 - 2 /hpf   Epithelial Cells (non renal) 0-10 0 - 10 /hpf   Bacteria, UA None seen None seen/Few  Urinalysis, Complete  Result Value Ref Range   Specific Gravity, UA 1.015 1.005 - 1.030   pH, UA 6.0 5.0 - 7.5   Color, UA Orange Yellow   Appearance Ur Cloudy (A) Clear   Leukocytes,UA Trace (A) Negative   Protein,UA Trace (A)  Negative/Trace   Glucose, UA Negative Negative   Ketones, UA Negative Negative   RBC, UA 3+ (A) Negative   Bilirubin, UA Negative Negative   Urobilinogen, Ur 1.0 0.2 - 1.0 mg/dL   Nitrite, UA Negative Negative   Microscopic Examination See below:    Assessment & Plan:   1. Gross hematuria Accompanied by right flank pain, likely secondary to known polycystic kidneys.  UA with mild pyuria consistent with polycystic kidneys and not overtly concerning for infection, though will send for culture for further evaluation.  Will treat with Percocet in the interim.  Recommend repeat CTU for monitoring of complex  cysts with plans for a follow-up cystoscopy with Dr. Bernardo Heater.  She is afebrile, VSS today.  No indication for urgent intervention. - Urinalysis, Complete - CT HEMATURIA WORKUP; Future - CULTURE, URINE COMPREHENSIVE - oxyCODONE-acetaminophen (PERCOCET/ROXICET) 5-325 MG tablet; Take 1-2 tablets by mouth every 6 (six) hours as needed for severe pain.  Dispense: 12 tablet; Refill: 0  2. Polycystic kidney disease Patient not managed by a nephrologist for polycystic kidneys.  Hypertension mild in triage today. I am placing referral with nephrology today for ongoing management of her polycystic kidneys. - Ambulatory referral to Nephrology   Return in about 4 weeks (around 06/21/2020) for Cysto and CTU results with Dr. Bernardo Heater.  Debroah Loop, PA-C  [facility] [address], [place], [place] [address] [phone]

## 2020-05-24 NOTE — Patient Instructions (Signed)
Today we discussed that the most likely source of your right flank pain is a ruptured kidney cyst.  Your urine today does not look infected, however I will still send your urine for culture to confirm this.  If your urine culture comes back positive for infection, I will call you.  If it is negative, I will not call you.  I have placed an order for CT scan of your kidneys.  The imaging department will call you to schedule this.  Additionally, I would like to have you come back to clinic in about 1 month for a cystoscopy with Dr. Bernardo Heater to look inside your bladder for other sources of the blood in your urine.  Lastly, I have placed a referral today for you to be seen by Dr. Juleen China in nephrology.  His office will contact you to schedule this appointment.  Please continue to take your antinausea medication and I have sent pain medication to your pharmacy today.  If you develop fever or worsening pain, chills, nausea, or vomiting, please contact our office immediately.

## 2020-05-25 LAB — MICROSCOPIC EXAMINATION
Bacteria, UA: NONE SEEN
RBC, Urine: >30 /hpf — AB (ref 0–2)

## 2020-05-25 LAB — URINALYSIS, COMPLETE
Bilirubin, UA: NEGATIVE
Glucose, UA: NEGATIVE
Ketones, UA: NEGATIVE
Nitrite, UA: NEGATIVE
Specific Gravity, UA: 1.015 (ref 1.005–1.030)
Urobilinogen, Ur: 1 mg/dL (ref 0.2–1.0)
pH, UA: 6 (ref 5.0–7.5)

## 2020-05-26 LAB — CULTURE, URINE COMPREHENSIVE

## 2020-05-27 LAB — CULTURE, URINE COMPREHENSIVE

## 2020-06-07 DIAGNOSIS — N281 Cyst of kidney, acquired: Secondary | ICD-10-CM | POA: Insufficient documentation

## 2020-06-09 ENCOUNTER — Other Ambulatory Visit (HOSPITAL_COMMUNITY): Payer: Self-pay | Admitting: Nephrology

## 2020-06-09 ENCOUNTER — Other Ambulatory Visit: Payer: Self-pay | Admitting: Nephrology

## 2020-06-09 DIAGNOSIS — R809 Proteinuria, unspecified: Secondary | ICD-10-CM

## 2020-06-09 DIAGNOSIS — R319 Hematuria, unspecified: Secondary | ICD-10-CM

## 2020-06-09 DIAGNOSIS — N1831 Chronic kidney disease, stage 3a: Secondary | ICD-10-CM

## 2020-06-09 DIAGNOSIS — Q612 Polycystic kidney, adult type: Secondary | ICD-10-CM

## 2020-06-11 ENCOUNTER — Other Ambulatory Visit
Admission: RE | Admit: 2020-06-11 | Discharge: 2020-06-11 | Disposition: A | Discharge status: Home / Self Care | Payer: 59 | Source: Ambulatory Visit | Attending: Gastroenterology | Admitting: Gastroenterology

## 2020-06-11 ENCOUNTER — Other Ambulatory Visit: Payer: Self-pay

## 2020-06-11 ENCOUNTER — Other Ambulatory Visit: Payer: Self-pay | Admitting: Urology

## 2020-06-11 DIAGNOSIS — Z20822 Contact with and (suspected) exposure to covid-19: Secondary | ICD-10-CM | POA: Diagnosis not present

## 2020-06-11 DIAGNOSIS — Z01812 Encounter for preprocedural laboratory examination: Secondary | ICD-10-CM | POA: Diagnosis present

## 2020-06-12 LAB — SARS CORONAVIRUS 2 (TAT 6-24 HRS): SARS Coronavirus 2: NEGATIVE

## 2020-06-14 ENCOUNTER — Encounter: Payer: Self-pay | Admitting: Gastroenterology

## 2020-06-14 ENCOUNTER — Other Ambulatory Visit: Payer: Self-pay

## 2020-06-14 ENCOUNTER — Ambulatory Visit
Admission: RE | Admit: 2020-06-14 | Discharge: 2020-06-14 | Disposition: A | Discharge status: Home / Self Care | Payer: 59 | Source: Ambulatory Visit | Attending: Nephrology | Admitting: Nephrology

## 2020-06-14 DIAGNOSIS — Q612 Polycystic kidney, adult type: Secondary | ICD-10-CM | POA: Insufficient documentation

## 2020-06-14 DIAGNOSIS — N1831 Chronic kidney disease, stage 3a: Secondary | ICD-10-CM | POA: Diagnosis not present

## 2020-06-14 DIAGNOSIS — R809 Proteinuria, unspecified: Secondary | ICD-10-CM | POA: Insufficient documentation

## 2020-06-14 DIAGNOSIS — R319 Hematuria, unspecified: Secondary | ICD-10-CM | POA: Insufficient documentation

## 2020-06-15 ENCOUNTER — Ambulatory Visit: Payer: 59 | Admitting: Anesthesiology

## 2020-06-15 ENCOUNTER — Encounter: Payer: Self-pay | Admitting: Gastroenterology

## 2020-06-15 ENCOUNTER — Encounter
Admission: RE | Disposition: A | Discharge status: Home / Self Care | Payer: Self-pay | Source: Home / Self Care | Attending: Gastroenterology

## 2020-06-15 ENCOUNTER — Other Ambulatory Visit: Payer: Self-pay

## 2020-06-15 ENCOUNTER — Ambulatory Visit
Admission: RE | Admit: 2020-06-15 | Discharge: 2020-06-15 | Disposition: A | Discharge status: Home / Self Care | Payer: 59 | Attending: Gastroenterology | Admitting: Gastroenterology

## 2020-06-15 DIAGNOSIS — Z86010 Personal history of colonic polyps: Secondary | ICD-10-CM | POA: Diagnosis not present

## 2020-06-15 DIAGNOSIS — K5730 Diverticulosis of large intestine without perforation or abscess without bleeding: Secondary | ICD-10-CM | POA: Diagnosis not present

## 2020-06-15 DIAGNOSIS — Z882 Allergy status to sulfonamides status: Secondary | ICD-10-CM | POA: Insufficient documentation

## 2020-06-15 DIAGNOSIS — Z79899 Other long term (current) drug therapy: Secondary | ICD-10-CM | POA: Insufficient documentation

## 2020-06-15 DIAGNOSIS — Z1211 Encounter for screening for malignant neoplasm of colon: Secondary | ICD-10-CM | POA: Insufficient documentation

## 2020-06-15 HISTORY — PX: COLONOSCOPY WITH PROPOFOL: SHX5780

## 2020-06-15 SURGERY — COLONOSCOPY WITH PROPOFOL
Anesthesia: General

## 2020-06-15 MED ORDER — PROPOFOL 500 MG/50ML IV EMUL
INTRAVENOUS | Status: DC | PRN
  Administered 2020-06-15: 175 ug/kg/min via INTRAVENOUS

## 2020-06-15 MED ORDER — FENTANYL CITRATE (PF) 100 MCG/2ML IJ SOLN
INTRAMUSCULAR | Status: AC
  Filled 2020-06-15: qty 2

## 2020-06-15 MED ORDER — PROPOFOL 10 MG/ML IV BOLUS
INTRAVENOUS | Status: AC
  Filled 2020-06-15: qty 20

## 2020-06-15 MED ORDER — PROPOFOL 10 MG/ML IV BOLUS
INTRAVENOUS | Status: DC | PRN
  Administered 2020-06-15: 50 mg via INTRAVENOUS

## 2020-06-15 MED ORDER — SODIUM CHLORIDE 0.9 % IV SOLN: INTRAVENOUS | Status: DC

## 2020-06-15 NOTE — H&P (Signed)
Jonathon Bellows, MD [address] [place], [place], [place], [address] [address], [place], [place], [place], [address] Phone: [phone]  Fax: [phone]  Primary Care Physician:  Patient, No Pcp Per   Pre-Procedure History & Physical: HPI:  Lori Nguyen is a 64 y.o. female is here for an colonoscopy.   Past Medical History:  Diagnosis Date  . Anemia   . Arthritis   . Diabetes mellitus without complication ([place])    gestational diabetes in 89  . GERD (gastroesophageal reflux disease)   . Hypertension   . Polycystic kidney   . Pre-diabetes   . Vaginal bleeding   . Vitamin D deficiency     Past Surgical History:  Procedure Laterality Date  . BREAST EXCISIONAL BIOPSY Left 2001   benign  . BREAST SURGERY Left    Breast Biopsy  . CESAREAN SECTION  1990  . COLONOSCOPY    . COLONOSCOPY WITH PROPOFOL N/A 05/03/2017   Procedure: COLONOSCOPY WITH PROPOFOL;  Surgeon: Jonathon Bellows, MD;  Location: [facility] ENDOSCOPY;  Service: Gastroenterology;  Laterality: N/A;  . CYSTOSCOPY  07/05/2017   Procedure: CYSTOSCOPY;  Surgeon: Gae Dry, MD;  Location: [facility] ORS;  Service: Gynecology;;  . DILATION AND CURETTAGE OF UTERUS N/A 05/17/2017   unable to be done due to cervical stenosis  . HIP SURGERY     history of R hip repair  . JOINT REPLACEMENT Right January 2016   BIL Hip Replacement, Dr. Rudene Christians  . TONSILLECTOMY    . TOTAL HIP ARTHROPLASTY Left 06/15/2015   Procedure: TOTAL HIP ARTHROPLASTY ANTERIOR APPROACH;  Surgeon: Hessie Knows, MD;  Location: [facility] ORS;  Service: Orthopedics;  Laterality: Left;  . TOTAL LAPAROSCOPIC HYSTERECTOMY WITH SALPINGECTOMY N/A 07/05/2017   Procedure: TOTAL LAPAROSCOPIC HYSTERECTOMY, BSO;  Surgeon: Gae Dry, MD;  Location: [facility] ORS;  Service: Gynecology;  Laterality: N/A;  . TUBAL LIGATION      Prior to Admission medications   Medication Sig Start Date End Date Taking? Authorizing Provider  aspirin EC 81 MG tablet Take 81 mg by mouth daily.    Yes [provider]  Cholecalciferol (VITAMIN D3 PO) Take 1 tablet by mouth daily.   Yes [provider]  etodolac (LODINE) 400 MG tablet Take 400 mg by mouth 2 (two) times daily as needed (for pain.).  04/12/17  Yes [provider]  ferrous sulfate 325 (65 FE) MG tablet Take 325 mg by mouth daily with breakfast.   Yes [provider]  hydrochlorothiazide (HYDRODIURIL) 25 MG tablet  03/21/19  Yes [provider]  losartan (COZAAR) 100 MG tablet  11/22/18  Yes [provider]  meclizine (ANTIVERT) 25 MG tablet Take 1 tablet (25 mg total) by mouth 3 (three) times daily as needed for dizziness. 11/17/19  Yes Melynda Ripple, MD  ondansetron (ZOFRAN ODT) 8 MG disintegrating tablet 1/2- 1 tablet q 8 hr prn nausea, vomiting 11/17/19  Yes Melynda Ripple, MD  oxyCODONE-acetaminophen (PERCOCET/ROXICET) 5-325 MG tablet Take 1-2 tablets by mouth every 6 (six) hours as needed for severe pain. 05/24/20  Yes Vaillancourt, Samantha, PA-C  pantoprazole (PROTONIX) 40 MG tablet Take 40 mg by mouth daily.    Yes [provider]  potassium chloride (K-DUR) 10 MEQ tablet Take 10 mEq by mouth daily.  04/12/17  Yes [provider]    Allergies as of 05/14/2020 - Review Complete 05/14/2020  Allergen Reaction Noted  . Latex Hives and Swelling 05/01/2015  . Sulfa antibiotics Rash 11/17/2019  .  Sulfur Rash 05/02/2018    Family History  Problem Relation Age of Onset  . Hypertension Mother   . Lung cancer Father   . Lung cancer Sister   . Breast cancer Neg Hx   . Bladder Cancer Neg Hx   . Kidney cancer Neg Hx     Social History   Socioeconomic History  . Marital status: Married    Spouse name: Not on file  . Number of children: Not on file  . Years of education: Not on file  . Highest education level: Not on file  Occupational History  . Not on file  Tobacco Use  . Smoking status: Never Smoker  . Smokeless tobacco: Never Used    Vaping Use  . Vaping Use: Never used  Substance and Sexual Activity  . Alcohol use: Yes    Comment: rare  . Drug use: No  . Sexual activity: Not Currently    Birth control/protection: Post-menopausal  Other Topics Concern  . Not on file  Social History Narrative  . Not on file   Social Determinants of Health   Financial Resource Strain:   . Difficulty of Paying Living Expenses: Not on file  Food Insecurity:   . Worried About Charity fundraiser in the Last Year: Not on file  . Ran Out of Food in the Last Year: Not on file  Transportation Needs:   . Lack of Transportation (Medical): Not on file  . Lack of Transportation (Non-Medical): Not on file  Physical Activity:   . Days of Exercise per Week: Not on file  . Minutes of Exercise per Session: Not on file  Stress:   . Feeling of Stress : Not on file  Social Connections:   . Frequency of Communication with Friends and Family: Not on file  . Frequency of Social Gatherings with Friends and Family: Not on file  . Attends Religious Services: Not on file  . Active Member of Clubs or Organizations: Not on file  . Attends Archivist Meetings: Not on file  . Marital Status: Not on file  Intimate Partner Violence:   . Fear of Current or Ex-Partner: Not on file  . Emotionally Abused: Not on file  . Physically Abused: Not on file  . Sexually Abused: Not on file    Review of Systems: See HPI, otherwise negative ROS  Physical Exam: BP (!) 142/77   Pulse 86   Temp (!) 96.6 F (35.9 C) (Temporal)   Resp 17   Ht 5' (1.524 m)   Wt 68.5 kg   SpO2 94%   BMI 29.49 kg/m  General:   Alert,  pleasant and cooperative in NAD Head:  Normocephalic and atraumatic. Neck:  Supple; no masses or thyromegaly. Lungs:  Clear throughout to auscultation, normal respiratory effort.    Heart:  +S1, +S2, Regular rate and rhythm, No edema. Abdomen:  Soft, nontender and nondistended. Normal bowel sounds, without guarding, and without  rebound.   Neurologic:  Alert and  oriented x4;  grossly normal neurologically.  Impression/Plan: Lori Nguyen is here for an colonoscopy to be performed for surveillance due to prior history of colon polyps   Risks, benefits, limitations, and alternatives regarding  colonoscopy have been reviewed with the patient.  Questions have been answered.  All parties agreeable.   Jonathon Bellows, MD  06/15/2020, 8:59 AM

## 2020-06-15 NOTE — Op Note (Signed)
[facility] Gastroenterology Patient Name: Lori Nguyen Procedure Date: 06/15/2020 8:57 AM MRN: [id number] Account #: [id number] Date of Birth: [birth year] Admit Type: Outpatient Age: 64 Room: [facility] ENDO ROOM 2 Gender: Female Note Status: Finalized Procedure:             Colonoscopy Indications:           Surveillance: Personal history of adenomatous polyps                         on last colonoscopy 3 years ago Providers:             Jonathon Bellows MD, MD Referring MD:          No Local Md, MD (Referring MD) Medicines:             Monitored Anesthesia Care Complications:         No immediate complications. Procedure:             Pre-Anesthesia Assessment:                        - Prior to the procedure, a History and Physical was                         performed, and patient medications, allergies and                         sensitivities were reviewed. The patient's tolerance                         of previous anesthesia was reviewed.                        - The risks and benefits of the procedure and the                         sedation options and risks were discussed with the                         patient. All questions were answered and informed                         consent was obtained.                        - ASA Grade Assessment: II - A patient with mild                         systemic disease.                        After obtaining informed consent, the colonoscope was                         passed under direct vision. Throughout the procedure,                         the patient's blood pressure, pulse, and oxygen                         saturations were monitored continuously.  The                         Colonoscope was introduced through the anus and                         advanced to the the cecum, identified by the                         appendiceal orifice. The colonoscopy was performed                         with ease. The patient  tolerated the procedure well.                         The quality of the bowel preparation was excellent. Findings:      The perianal and digital rectal examinations were normal.      Multiple small-mouthed diverticula were found in the sigmoid colon.      The exam was otherwise without abnormality. Impression:            - Diverticulosis in the sigmoid colon.                        - The examination was otherwise normal.                        - No specimens collected. Recommendation:        - Discharge patient to home (with escort).                        - Resume previous diet.                        - Continue present medications.                        - Repeat colonoscopy in 5 years for surveillance. Procedure Code(s):     --- Professional ---                        [id number], Colonoscopy, flexible; diagnostic, including                         collection of specimen(s) by brushing or washing, when                         performed (separate procedure) Diagnosis Code(s):     --- Professional ---                        Z86.010, Personal history of colonic polyps                        K57.30, Diverticulosis of large intestine without                         perforation or abscess without bleeding CPT copyright 2019 American Medical Association. All rights reserved. The codes documented in this report are preliminary and upon coder review may  be revised to meet current compliance requirements. Jonathon Bellows, MD Jonathon Bellows MD,  MD 06/15/2020 9:36:48 AM This report has been signed electronically. Number of Addenda: 0 Note Initiated On: 06/15/2020 8:57 AM Scope Withdrawal Time: 0 hours 12 minutes 35 seconds  Total Procedure Duration: 0 hours 21 minutes 41 seconds  Estimated Blood Loss:  Estimated blood loss: none.      [facility]

## 2020-06-15 NOTE — Transfer of Care (Signed)
Immediate Anesthesia Transfer of Care Note  Patient: Lori Nguyen  Procedure(s) Performed: COLONOSCOPY WITH PROPOFOL (N/A )  Patient Location: Endoscopy Unit  Anesthesia Type:General  Level of Consciousness: awake, alert  and oriented  Airway & Oxygen Therapy: Patient Spontanous Breathing  Post-op Assessment: Report given to RN and Post -op Vital signs reviewed and stable  Post vital signs: Reviewed and stable  Last Vitals:  Vitals Value Taken Time  BP    Temp    Pulse    Resp    SpO2      Last Pain:  Vitals:   06/15/20 0814  TempSrc: Temporal  PainSc: 0-No pain         Complications: No complications documented.

## 2020-06-15 NOTE — Anesthesia Preprocedure Evaluation (Signed)
Anesthesia Evaluation  Patient identified by MRN, date of birth, ID band Patient awake    Reviewed: Allergy & Precautions, H&P , NPO status , Patient's Chart, lab work & pertinent test results  History of Anesthesia Complications Negative for: history of anesthetic complications  Airway Mallampati: III  TM Distance: >3 FB Neck ROM: full    Dental  (+) Chipped   Pulmonary neg pulmonary ROS, neg shortness of breath,    Pulmonary exam normal        Cardiovascular Exercise Tolerance: Good hypertension, Normal cardiovascular exam     Neuro/Psych negative neurological ROS  negative psych ROS   GI/Hepatic Neg liver ROS, GERD  Medicated and Controlled,  Endo/Other  diabetes, Type 2  Renal/GU Renal disease  negative genitourinary   Musculoskeletal  (+) Arthritis ,   Abdominal   Peds  Hematology negative hematology ROS (+)   Anesthesia Other Findings Past Medical History: No date: Anemia No date: Arthritis No date: Diabetes mellitus without complication ([facility])     Comment:  gestational diabetes in 1990 No date: GERD (gastroesophageal reflux disease) No date: Hypertension No date: Polycystic kidney No date: Pre-diabetes No date: Vaginal bleeding No date: Vitamin D deficiency  Past Surgical History: 2001: BREAST EXCISIONAL BIOPSY; Left     Comment:  benign No date: BREAST SURGERY; Left     Comment:  Breast Biopsy 1990: CESAREAN SECTION No date: COLONOSCOPY 05/03/2017: COLONOSCOPY WITH PROPOFOL; N/A     Comment:  Procedure: COLONOSCOPY WITH PROPOFOL;  Surgeon: Jonathon Bellows, MD;  Location[facility] ENDOSCOPY;  Service:               Gastroenterology;  Laterality: N/A; 07/05/2017: CYSTOSCOPY     Comment:  Procedure: CYSTOSCOPY;  Surgeon: Gae Dry, MD;                Location: [facility] ORS;  Service: Gynecology;; 05/17/2017: DILATION AND CURETTAGE OF UTERUS; N/A     Comment:  unable to be done due to  cervical stenosis No date: HIP SURGERY     Comment:  history of R hip repair January 2016: JOINT REPLACEMENT; Right     Comment:  BIL Hip Replacement, Dr. Rudene Christians No date: TONSILLECTOMY 06/15/2015: TOTAL HIP ARTHROPLASTY; Left     Comment:  Procedure: TOTAL HIP ARTHROPLASTY ANTERIOR APPROACH;                Surgeon: Hessie Knows, MD;  Location: [facility] ORS;  Service:              Orthopedics;  Laterality: Left; 07/05/2017: TOTAL LAPAROSCOPIC HYSTERECTOMY WITH SALPINGECTOMY; N/A     Comment:  Procedure: TOTAL LAPAROSCOPIC HYSTERECTOMY, BSO;                Surgeon: Gae Dry, MD;  Location: [facility] ORS;                Service: Gynecology;  Laterality: N/A; No date: TUBAL LIGATION  BMI    Body Mass Index: 29.49 kg/m      Reproductive/Obstetrics negative OB ROS                             Anesthesia Physical Anesthesia Plan  ASA: III  Anesthesia Plan: General   Post-op Pain Management:    Induction: Intravenous  PONV Risk Score and Plan: Propofol infusion and TIVA  Airway Management Planned: Natural  Airway and Nasal Cannula  Additional Equipment:   Intra-op Plan:   Post-operative Plan:   Informed Consent: I have reviewed the patients History and Physical, chart, labs and discussed the procedure including the risks, benefits and alternatives for the proposed anesthesia with the patient or authorized representative who has indicated his/her understanding and acceptance.     Dental Advisory Given  Plan Discussed with: Anesthesiologist, CRNA and Surgeon  Anesthesia Plan Comments: (Patient consented for risks of anesthesia including but not limited to:  - adverse reactions to medications - risk of intubation if required - damage to eyes, teeth, lips or other oral mucosa - nerve damage due to positioning  - sore throat or hoarseness - Damage to heart, brain, nerves, lungs, other parts of body or loss of life  Patient voiced understanding.)         Anesthesia Quick Evaluation

## 2020-06-15 NOTE — Anesthesia Postprocedure Evaluation (Signed)
Anesthesia Post Note  Patient: Lori Nguyen  Procedure(s) Performed: COLONOSCOPY WITH PROPOFOL (N/A )  Patient location during evaluation: Endoscopy Anesthesia Type: General Level of consciousness: awake and alert and oriented Pain management: pain level controlled Vital Signs Assessment: post-procedure vital signs reviewed and stable Respiratory status: spontaneous breathing, nonlabored ventilation and respiratory function stable Cardiovascular status: blood pressure returned to baseline and stable Postop Assessment: no signs of nausea or vomiting Anesthetic complications: no   No complications documented.   Last Vitals:  Vitals:   06/15/20 1000 06/15/20 1010  BP: 137/72 (!) 142/76  Pulse: (!) 57   Resp: 20 20  Temp:    SpO2: 100% 100%    Last Pain:  Vitals:   06/15/20 0930  TempSrc: Temporal  PainSc:                  [name]

## 2020-06-16 ENCOUNTER — Encounter: Payer: Self-pay | Admitting: Gastroenterology

## 2020-06-16 ENCOUNTER — Other Ambulatory Visit: Payer: Self-pay | Admitting: Urology

## 2020-06-23 ENCOUNTER — Other Ambulatory Visit: Payer: Self-pay | Admitting: Urology

## 2020-06-28 ENCOUNTER — Other Ambulatory Visit: Payer: Self-pay | Admitting: Urology

## 2020-07-15 ENCOUNTER — Ambulatory Visit: Payer: 59

## 2020-07-26 ENCOUNTER — Ambulatory Visit
Admission: RE | Admit: 2020-07-26 | Discharge: 2020-07-26 | Disposition: A | Discharge status: Home / Self Care | Payer: 59 | Source: Ambulatory Visit | Attending: Physician Assistant | Admitting: Physician Assistant

## 2020-07-26 ENCOUNTER — Other Ambulatory Visit: Payer: Self-pay

## 2020-07-26 DIAGNOSIS — R310 Gross hematuria: Secondary | ICD-10-CM | POA: Insufficient documentation

## 2020-07-26 LAB — POCT I-STAT CREATININE: Creatinine, Ser: 0.8 mg/dL (ref 0.44–1.00)

## 2020-07-26 MED ORDER — IOHEXOL 300 MG/ML  SOLN
100.0000 mL | Freq: Once | INTRAMUSCULAR | Status: AC | PRN
  Administered 2020-07-26: 13:00:00 100 mL via INTRAVENOUS

## 2020-10-12 NOTE — Progress Notes (Signed)
Patient, No Pcp Per   Chief Complaint  Patient presents with  . Breast Exam    Sore LB breast only, no lumps, redness or swelling    HPI:      Ms. Lori Nguyen is a 65 y.o. G1P1001 whose LMP was No LMP recorded. Patient has had a hysterectomy., presents today for LT breast pain after fall a wk ago. Missed a step while carrying a box and fell on LT side, LT flank and LT breast. Was painful to just touch LT breast initially but sx improving. No masses, erythema, nipple d/c, bruising. No breast sx prior to fall. No FH breast cancer. Neg mammo 05/13/20   Past Medical History:  Diagnosis Date  . Anemia   . Arthritis   . Diabetes mellitus without complication ([place])    gestational diabetes in 63  . GERD (gastroesophageal reflux disease)   . Hypertension   . Polycystic kidney   . Pre-diabetes   . Vaginal bleeding   . Vitamin D deficiency     Past Surgical History:  Procedure Laterality Date  . BREAST EXCISIONAL BIOPSY Left 2001   benign  . BREAST SURGERY Left    Breast Biopsy  . CESAREAN SECTION  1990  . COLONOSCOPY    . COLONOSCOPY WITH PROPOFOL N/A 05/03/2017   Procedure: COLONOSCOPY WITH PROPOFOL;  Surgeon: Jonathon Bellows, MD;  Location: [facility] ENDOSCOPY;  Service: Gastroenterology;  Laterality: N/A;  . COLONOSCOPY WITH PROPOFOL N/A 06/15/2020   Procedure: COLONOSCOPY WITH PROPOFOL;  Surgeon: Jonathon Bellows, MD;  Location: [facility] ENDOSCOPY;  Service: Gastroenterology;  Laterality: N/A;  . CYSTOSCOPY  07/05/2017   Procedure: CYSTOSCOPY;  Surgeon: Gae Dry, MD;  Location: [facility] ORS;  Service: Gynecology;;  . DILATION AND CURETTAGE OF UTERUS N/A 05/17/2017   unable to be done due to cervical stenosis  . HIP SURGERY     history of R hip repair  . JOINT REPLACEMENT Right January 2016   BIL Hip Replacement, Dr. Rudene Christians  . TONSILLECTOMY    . TOTAL HIP ARTHROPLASTY Left 06/15/2015   Procedure: TOTAL HIP ARTHROPLASTY ANTERIOR APPROACH;  Surgeon: Hessie Knows, MD;  Location: [facility]  ORS;  Service: Orthopedics;  Laterality: Left;  . TOTAL LAPAROSCOPIC HYSTERECTOMY WITH SALPINGECTOMY N/A 07/05/2017   Procedure: TOTAL LAPAROSCOPIC HYSTERECTOMY, BSO;  Surgeon: Gae Dry, MD;  Location: [facility] ORS;  Service: Gynecology;  Laterality: N/A;  . TUBAL LIGATION      Family History  Problem Relation Age of Onset  . Hypertension Mother   . Lung cancer Father   . Lung cancer Sister   . Breast cancer Neg Hx   . Bladder Cancer Neg Hx   . Kidney cancer Neg Hx     Social History   Socioeconomic History  . Marital status: Married    Spouse name: Not on file  . Number of children: Not on file  . Years of education: Not on file  . Highest education level: Not on file  Occupational History  . Not on file  Tobacco Use  . Smoking status: Never Smoker  . Smokeless tobacco: Never Used  Vaping Use  . Vaping Use: Never used  Substance and Sexual Activity  . Alcohol use: Yes    Comment: rare  . Drug use: No  . Sexual activity: Not Currently    Birth control/protection: Post-menopausal  Other Topics Concern  . Not on file  Social History Narrative  . Not on file   Social Determinants of Health  Financial Resource Strain: Not on file  Food Insecurity: Not on file  Transportation Needs: Not on file  Physical Activity: Not on file  Stress: Not on file  Social Connections: Not on file  Intimate Partner Violence: Not on file    Outpatient Medications Prior to Visit  Medication Sig Dispense Refill  . aspirin EC 81 MG tablet Take 81 mg by mouth daily.    . Cholecalciferol (VITAMIN D3 PO) Take 1 tablet by mouth daily.    Marland Kitchen etodolac (LODINE) 400 MG tablet Take 400 mg by mouth 2 (two) times daily as needed (for pain.).     Marland Kitchen fluticasone (FLONASE) 50 MCG/ACT nasal spray Place into both nostrils.    . hydrochlorothiazide (HYDRODIURIL) 25 MG tablet     . losartan (COZAAR) 100 MG tablet     . meclizine (ANTIVERT) 25 MG tablet Take 1 tablet (25 mg total) by mouth 3 (three)  times daily as needed for dizziness. 30 tablet 0  . meloxicam (MOBIC) 15 MG tablet     . ondansetron (ZOFRAN ODT) 8 MG disintegrating tablet 1/2- 1 tablet q 8 hr prn nausea, vomiting 20 tablet 0  . pantoprazole (PROTONIX) 40 MG tablet Take 40 mg by mouth daily.     . potassium chloride (K-DUR) 10 MEQ tablet Take 10 mEq by mouth daily.     Marland Kitchen terbinafine (LAMISIL) 250 MG tablet TAKE 1 TABLET BY ORAL ROUTE EVERY DAY FOR TOE NAIL FUNGUS    . ferrous sulfate 325 (65 FE) MG tablet Take 325 mg by mouth daily with breakfast.    . oxyCODONE-acetaminophen (PERCOCET/ROXICET) 5-325 MG tablet Take 1-2 tablets by mouth every 6 (six) hours as needed for severe pain. 12 tablet 0   No facility-administered medications prior to visit.      ROS:  Review of Systems  Constitutional: Negative for fever.  Gastrointestinal: Negative for blood in stool, constipation, diarrhea, nausea and vomiting.  Genitourinary: Negative for dyspareunia, dysuria, flank pain, frequency, hematuria, urgency, vaginal bleeding, vaginal discharge and vaginal pain.  Musculoskeletal: Negative for back pain.  Skin: Negative for rash.   BREAST: pain   OBJECTIVE:   Vitals:  BP 130/80   Ht 4\' 11"  (1.499 m)   Wt 153 lb (69.4 kg)   BMI 30.90 kg/m   Physical Exam Vitals reviewed.  Pulmonary:     Effort: Pulmonary effort is normal.  Chest:  Breasts: Breasts are symmetrical.     Right: No inverted nipple, mass, nipple discharge, skin change or tenderness.     Left: No inverted nipple, mass, nipple discharge, skin change or tenderness.      Comments: NEG BREAST EXAM BILAT Musculoskeletal:        General: Normal range of motion.     Cervical back: Normal range of motion.  Skin:    General: Skin is warm and dry.  Neurological:     General: No focal deficit present.     Mental Status: She is alert and oriented to person, place, and time.     Cranial Nerves: No cranial nerve deficit.  Psychiatric:        Mood and Affect:  Mood normal.        Behavior: Behavior normal.        Thought Content: Thought content normal.        Judgment: Judgment normal.    Assessment/Plan: Breast pain, left--after fall/trauma to breast. Neg exam today, no masses. Pain sx improving. Reassurance. Can do heat/ice prn. F/u prn.  No follow-ups on file.  Savonna Birchmeier B. Dayson Aboud, PA-C 10/13/2020 11:05 AM

## 2020-10-13 ENCOUNTER — Ambulatory Visit (INDEPENDENT_AMBULATORY_CARE_PROVIDER_SITE_OTHER): Payer: 59 | Admitting: Obstetrics and Gynecology

## 2020-10-13 ENCOUNTER — Encounter: Payer: Self-pay | Admitting: Obstetrics and Gynecology

## 2020-10-13 ENCOUNTER — Other Ambulatory Visit: Payer: Self-pay

## 2020-10-13 VITALS — BP 130/80 | Ht 59.0 in | Wt 153.0 lb

## 2020-10-13 DIAGNOSIS — N644 Mastodynia: Secondary | ICD-10-CM | POA: Diagnosis not present

## 2020-10-13 NOTE — Patient Instructions (Signed)
I value your feedback and you entrusting us with your care. If you get a [facility] patient survey, I would appreciate you taking the time to let us know about your experience today. Thank you! ? ? ?

## 2021-06-30 IMAGING — US US RENAL
1 series · 14 of 25 positions shown · non-contrast
Comparison: CT abdomen/pelvis dated 09/21/2017

CLINICAL DATA: Proteinuria/hematuria

EXAM:
RENAL / URINARY TRACT ULTRASOUND COMPLETE

[Series 1: us renal · 0.23mm/px · 14 of 49 slices shown]
[im 1/49]
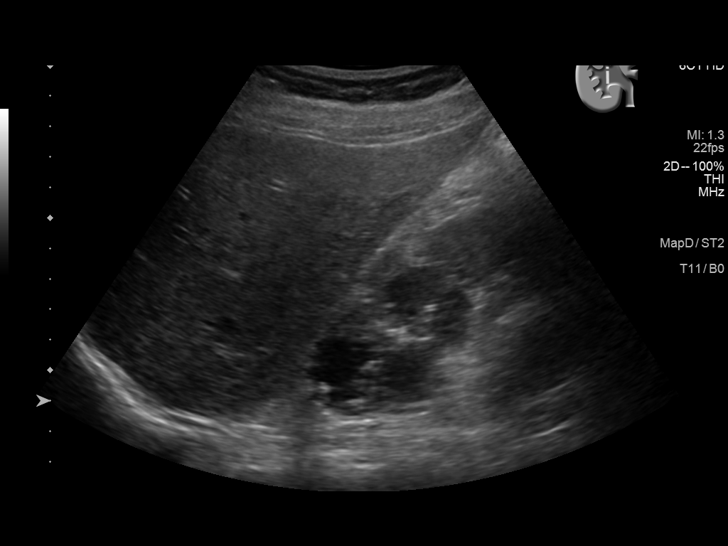
[im 5/49]
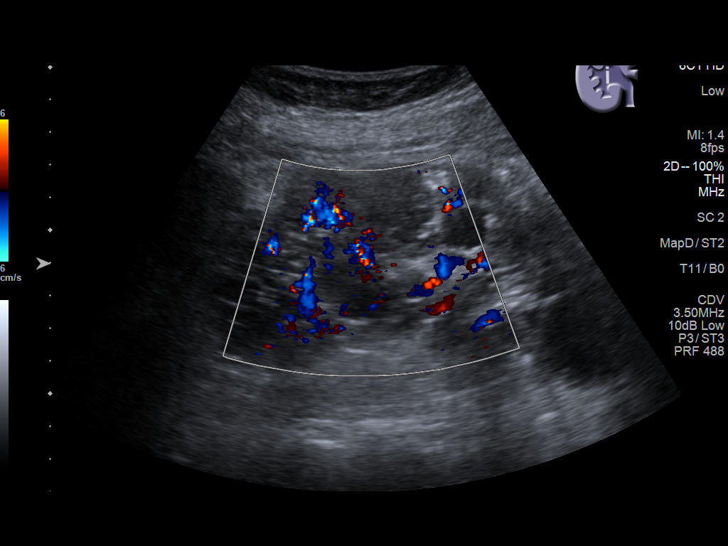
[im 9/49]
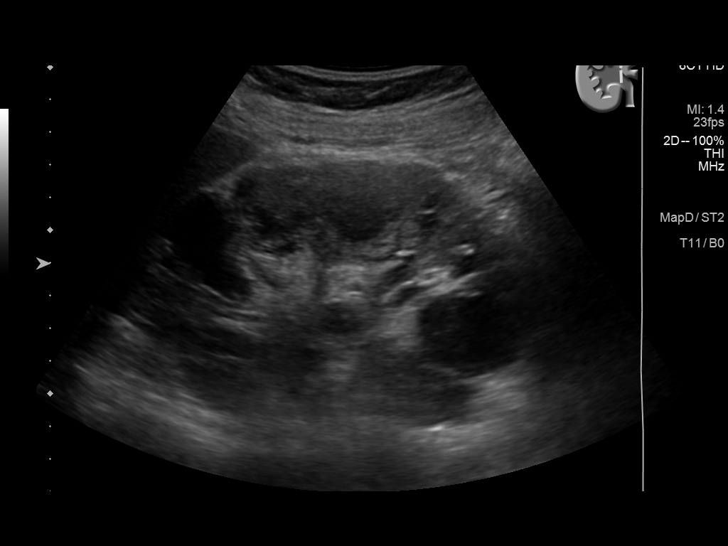
[im 13/49]
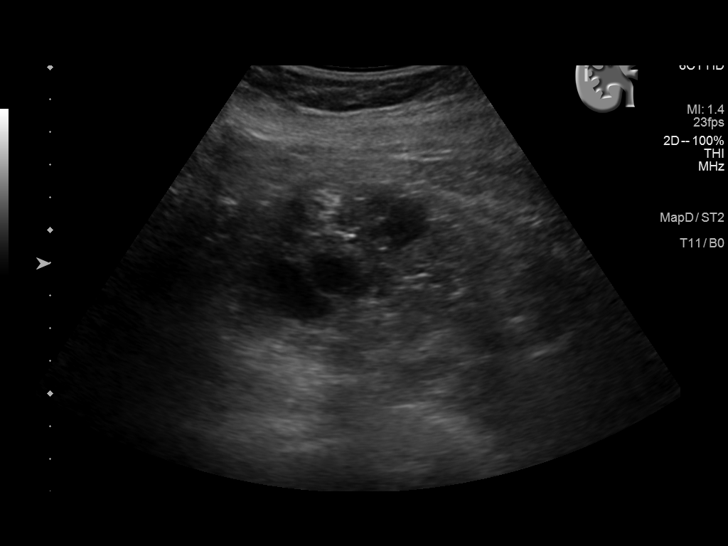
[im 17/49]
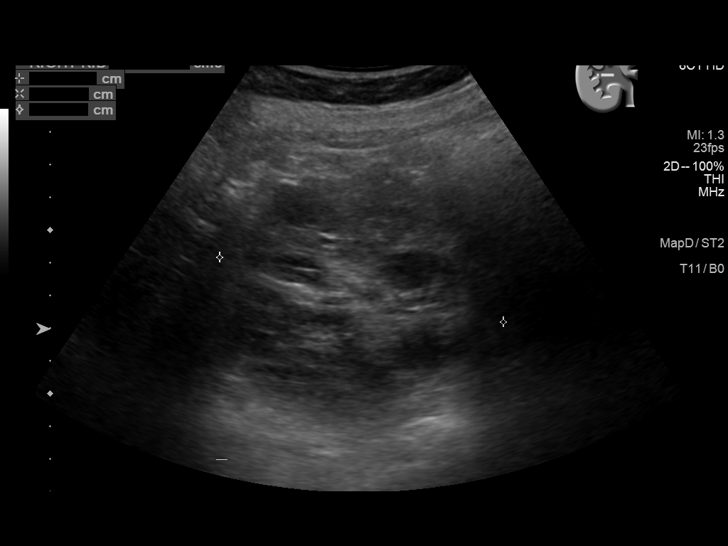
[im 19/49]
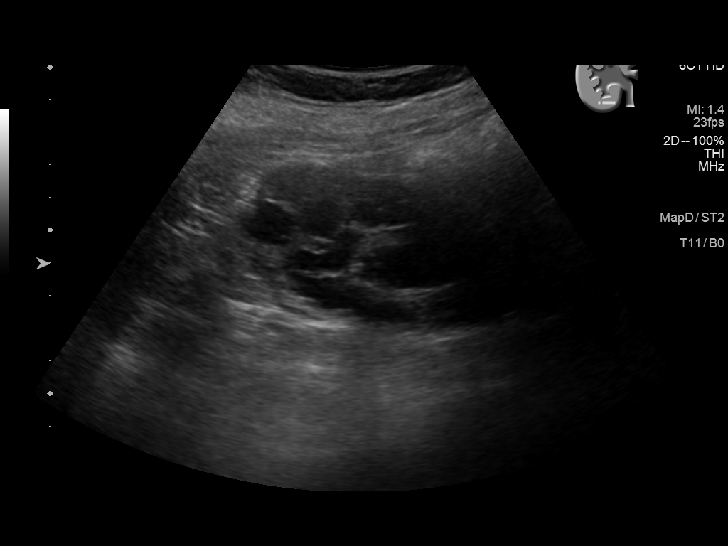
[im 23/49]
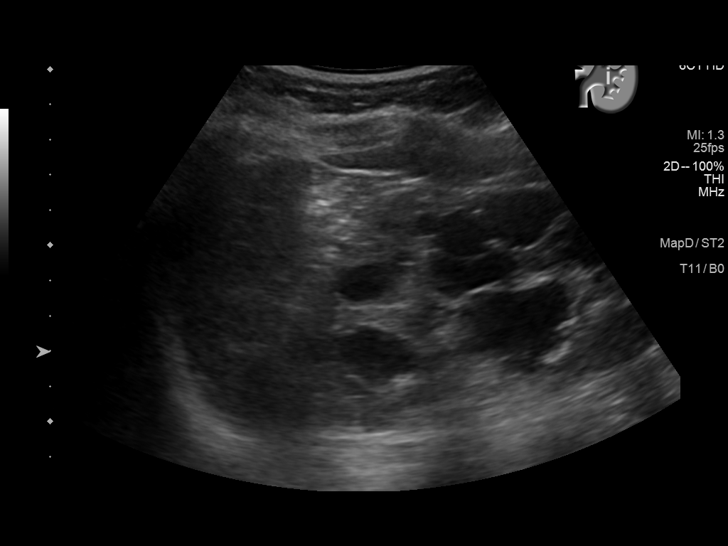
[im 27/49]
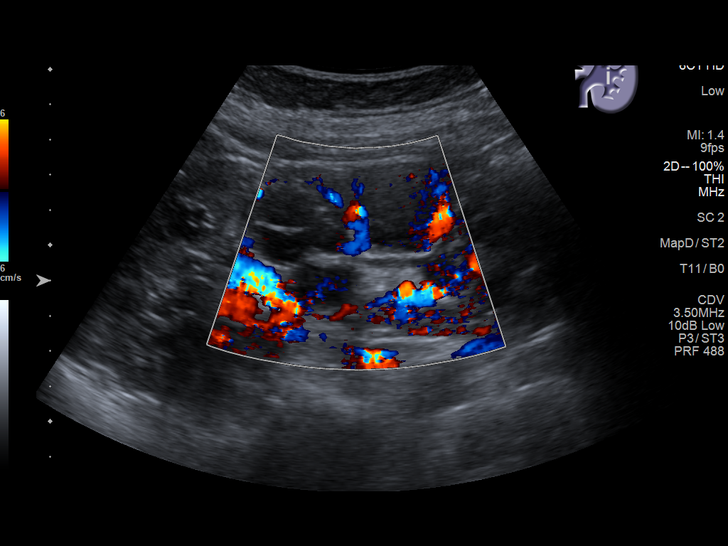
[im 31/49]
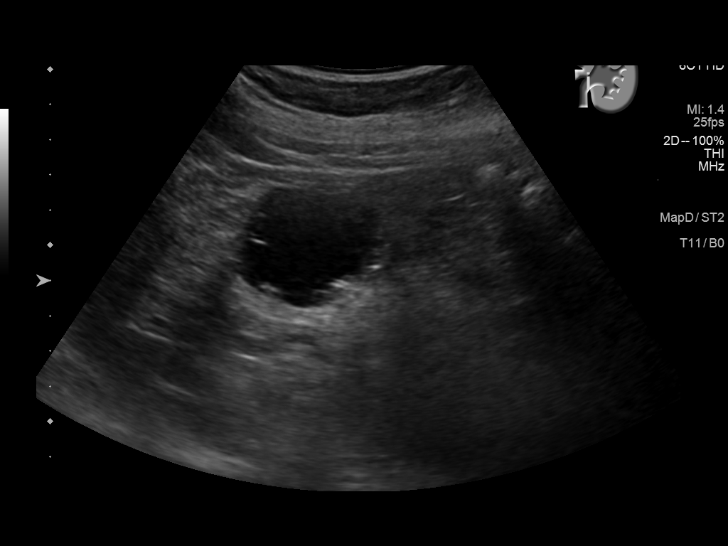
[im 33/49]
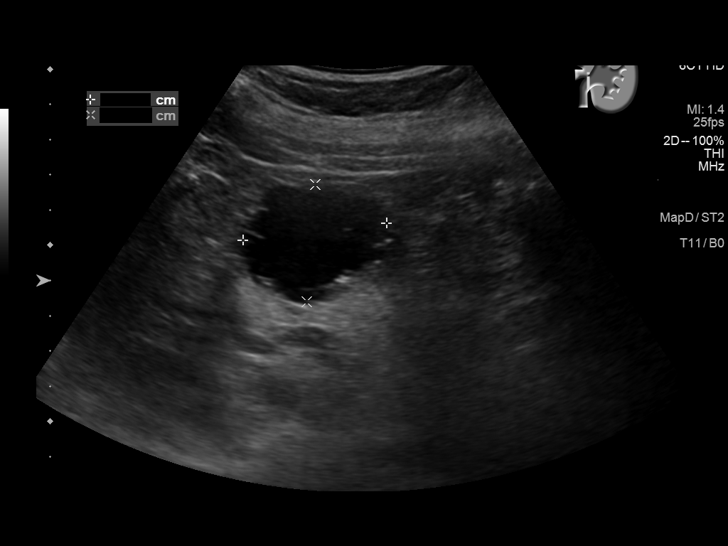
[im 37/49]
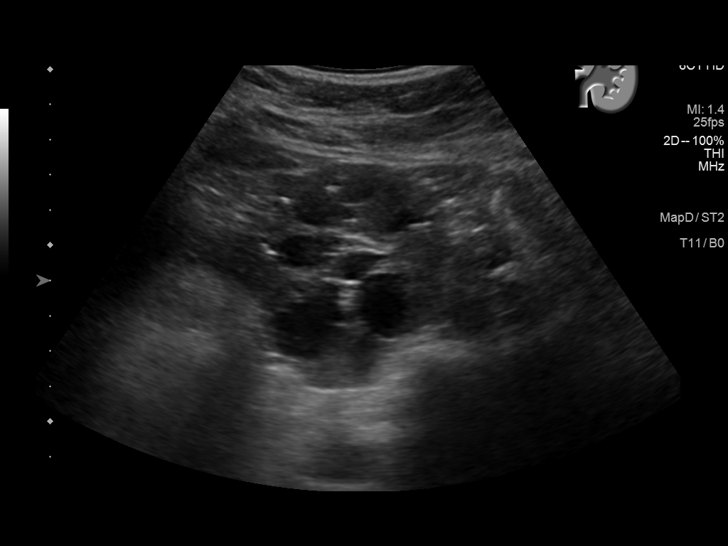
[im 41/49]
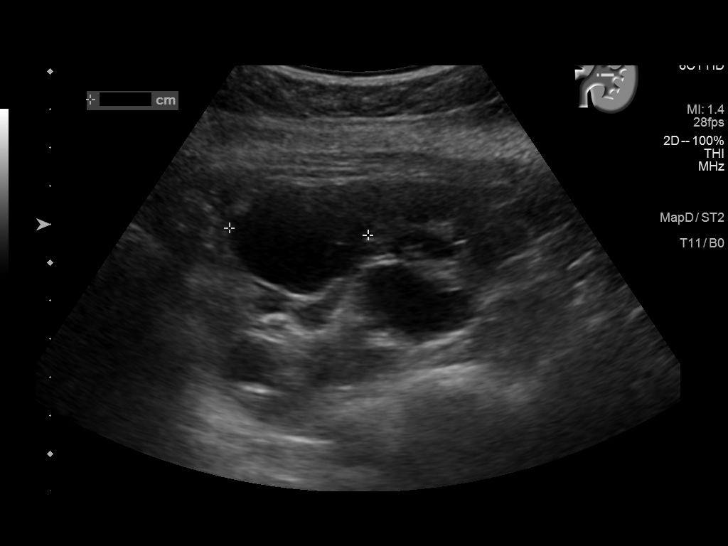
[im 45/49]
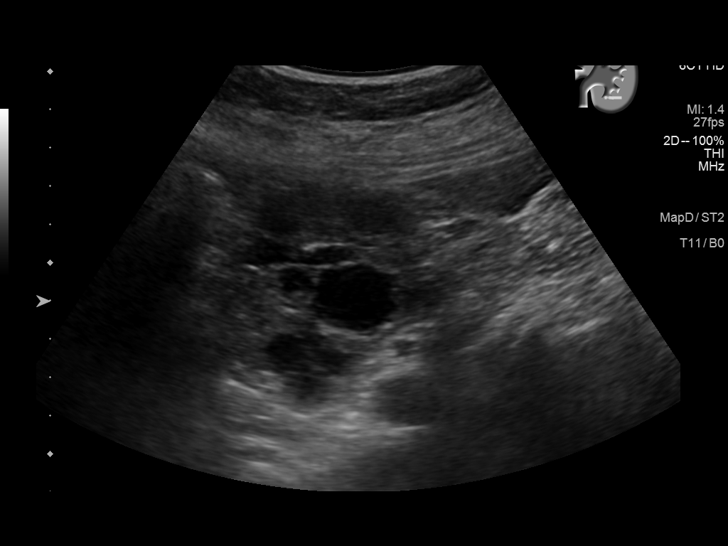
[im 49/49]
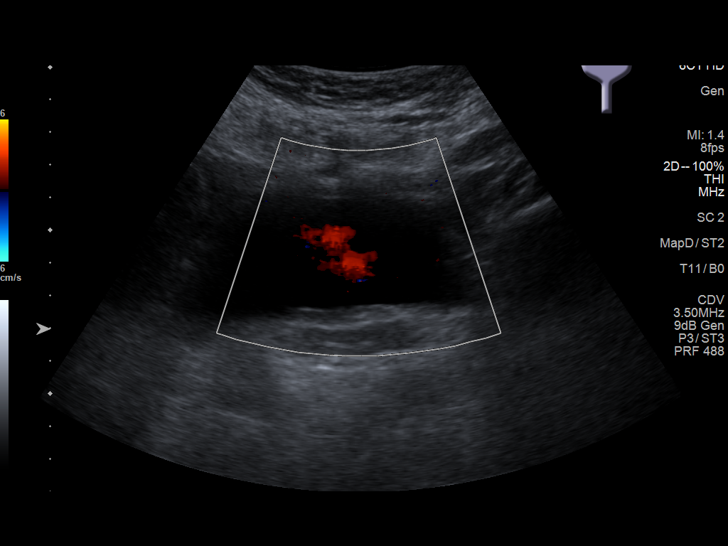

[14 of 25 positions shown; findings below may reference images not displayed]

FINDINGS: Right Kidney:

Renal measurements: 15.1 x 7.8 x 8.9 cm = volume: 551 mL. Numerous
bilateral renal cysts, most of which are simple, including a
dominant 3.6 x 3.2 x 3.9 cm simple cyst in the lower pole. No
hydronephrosis.

Left Kidney:

Renal measurements: 14.5 x 6.9 x 6.5 cm = volume: 342 mL. Numerous
bilateral renal cysts, most of which are simple, including a
dominant 4.1 x 3.3 x 3.6 cm cyst with mildly irregular margins in
the interpolar region. No hydronephrosis.

Bladder:

Appears normal for degree of bladder distention.

Other:

None.
IMPRESSION: Numerous bilateral renal cysts, most of which are simple, as above.
This appearance is compatible with polycystic kidney disease and
better evaluated on prior CT.

No hydronephrosis.

## 2021-07-12 ENCOUNTER — Other Ambulatory Visit: Payer: Self-pay | Admitting: Obstetrics and Gynecology

## 2021-07-15 ENCOUNTER — Other Ambulatory Visit: Payer: Self-pay | Admitting: Family Medicine

## 2021-07-15 DIAGNOSIS — Z1231 Encounter for screening mammogram for malignant neoplasm of breast: Secondary | ICD-10-CM

## 2021-08-05 ENCOUNTER — Ambulatory Visit
Admission: RE | Admit: 2021-08-05 | Discharge: 2021-08-05 | Disposition: A | Discharge status: Home / Self Care | Payer: 59 | Source: Ambulatory Visit | Attending: Family Medicine | Admitting: Family Medicine

## 2021-08-05 ENCOUNTER — Other Ambulatory Visit: Payer: Self-pay

## 2021-08-05 DIAGNOSIS — Z1231 Encounter for screening mammogram for malignant neoplasm of breast: Secondary | ICD-10-CM | POA: Diagnosis not present

## 2023-10-23 ENCOUNTER — Encounter: Payer: Self-pay | Admitting: Emergency Medicine

## 2023-10-23 ENCOUNTER — Ambulatory Visit
Admission: EM | Admit: 2023-10-23 | Discharge: 2023-10-23 | Disposition: A | Discharge status: Home / Self Care | Attending: Emergency Medicine | Admitting: Emergency Medicine

## 2023-10-23 DIAGNOSIS — S29012A Strain of muscle and tendon of back wall of thorax, initial encounter: Secondary | ICD-10-CM | POA: Diagnosis not present

## 2023-10-23 DIAGNOSIS — M62838 Other muscle spasm: Secondary | ICD-10-CM

## 2023-10-23 MED ORDER — TIZANIDINE HCL 4 MG PO TABS: 4.0000 mg | ORAL_TABLET | Freq: Three times a day (TID) | ORAL | 0 refills | Status: AC | PRN

## 2023-10-23 NOTE — ED Triage Notes (Signed)
 Pt c/o upper back pain and left shoulder pain x 1 week. Pt has tried OTC pain medication and using cold and heat packs with no relief.

## 2023-10-23 NOTE — ED Provider Notes (Signed)
 HPI  SUBJECTIVE:  Lori Nguyen is a right-handed 68 y.o. female who presents with 1 week of upper thoracic pain that started in the midline and is now moving laterally.  She describes it as feeling like a "pinched nerve", burning, sharp.  It was intermittent and has now become constant in her shoulder.  She reports constant left posterior shoulder pain starting yesterday.  No pain her deltoid or down her arm.  She was doing a lot of repetitive activity and heavy lifting while carrying for her husband before he passed away several weeks ago.  She states that she has had this pain intermittently since his passing, but it got worse over the past week.  No other change in physical activity, trauma to the back or neck.  No rash in the area of pain.  No numbness, tingling, weakness grip weakness in the left arm, fevers, hypersensitivity.  She has tried heat, alternating with ice, Aleve alternating with Tylenol without improvement in her symptoms.  Symptoms are better with lying down, worse when she sits at her desk.  It does not seem to be associated with arm/shoulder movement. She has a past medical history of gestational diabetes now resolved, GERD, hypertension, polycystic kidney disease, varicella.  No history of chronic kidney disease, shingles, rotator cuff injury.  PCP: None.  Past Medical History:  Diagnosis Date   Anemia    Arthritis    Diabetes mellitus without complication (HCC)    gestational diabetes in 1990   GERD (gastroesophageal reflux disease)    Hypertension    Polycystic kidney    Pre-diabetes    Vaginal bleeding    Vitamin D deficiency     Past Surgical History:  Procedure Laterality Date   BREAST EXCISIONAL BIOPSY Left 2001   benign   BREAST SURGERY Left    Breast Biopsy   CESAREAN SECTION  1990   COLONOSCOPY     COLONOSCOPY WITH PROPOFOL N/A 05/03/2017   Procedure: COLONOSCOPY WITH PROPOFOL;  Surgeon: Wyline Mood, MD;  Location: [facility] ENDOSCOPY;  Service:  Gastroenterology;  Laterality: N/A;   COLONOSCOPY WITH PROPOFOL N/A 06/15/2020   Procedure: COLONOSCOPY WITH PROPOFOL;  Surgeon: Wyline Mood, MD;  Location: [facility] ENDOSCOPY;  Service: Gastroenterology;  Laterality: N/A;   CYSTOSCOPY  07/05/2017   Procedure: CYSTOSCOPY;  Surgeon: Nadara Mustard, MD;  Location: [facility] ORS;  Service: Gynecology;;   DILATION AND CURETTAGE OF UTERUS N/A 05/17/2017   unable to be done due to cervical stenosis   HIP SURGERY     history of R hip repair   JOINT REPLACEMENT Right January 2016   BIL Hip Replacement, Dr. Rosita Kea   TONSILLECTOMY     TOTAL HIP ARTHROPLASTY Left 06/15/2015   Procedure: TOTAL HIP ARTHROPLASTY ANTERIOR APPROACH;  Surgeon: Kennedy Bucker, MD;  Location: [facility] ORS;  Service: Orthopedics;  Laterality: Left;   TOTAL LAPAROSCOPIC HYSTERECTOMY WITH SALPINGECTOMY N/A 07/05/2017   Procedure: TOTAL LAPAROSCOPIC HYSTERECTOMY, BSO;  Surgeon: Nadara Mustard, MD;  Location: [facility] ORS;  Service: Gynecology;  Laterality: N/A;   TUBAL LIGATION      Family History  Problem Relation Age of Onset   Hypertension Mother    Lung cancer Father    Lung cancer Sister    Breast cancer Neg Hx    Bladder Cancer Neg Hx    Kidney cancer Neg Hx     Social History   Tobacco Use   Smoking status: Never   Smokeless tobacco: Never  Vaping Use   Vaping  status: Never Used  Substance Use Topics   Alcohol use: Yes    Comment: rare   Drug use: No    No current facility-administered medications for this encounter.  Current Outpatient Medications:    tiZANidine (ZANAFLEX) 4 MG tablet, Take 1 tablet (4 mg total) by mouth every 8 (eight) hours as needed for muscle spasms., Disp: 30 tablet, Rfl: 0   aspirin EC 81 MG tablet, Take 81 mg by mouth daily., Disp: , Rfl:    Cholecalciferol (VITAMIN D3 PO), Take 1 tablet by mouth daily., Disp: , Rfl:    fluticasone (FLONASE) 50 MCG/ACT nasal spray, Place into both nostrils., Disp: , Rfl:    hydrochlorothiazide (HYDRODIURIL) 25  MG tablet, , Disp: , Rfl:    losartan (COZAAR) 100 MG tablet, , Disp: , Rfl:    meclizine (ANTIVERT) 25 MG tablet, Take 1 tablet (25 mg total) by mouth 3 (three) times daily as needed for dizziness., Disp: 30 tablet, Rfl: 0   pantoprazole (PROTONIX) 40 MG tablet, Take 40 mg by mouth daily. , Disp: , Rfl:    potassium chloride (K-DUR) 10 MEQ tablet, Take 10 mEq by mouth daily. , Disp: , Rfl:    terbinafine (LAMISIL) 250 MG tablet, TAKE 1 TABLET BY ORAL ROUTE EVERY DAY FOR TOE NAIL FUNGUS, Disp: , Rfl:   Allergies  Allergen Reactions   Latex Hives and Swelling   Elemental Sulfur Rash    Bad rash everywhere   Sulfa Antibiotics Rash     ROS  As noted in HPI.   Physical Exam  BP (!) 168/84 (BP Location: Right Arm)   Pulse 79   Temp 98.4 F (36.9 C) (Oral)   Resp 16   SpO2 98%   Constitutional: Well developed, well nourished, no acute distress Eyes:  EOMI, conjunctiva normal bilaterally HENT: Normocephalic, atraumatic,mucus membranes moist Respiratory: Normal inspiratory effort Cardiovascular: Normal rate GI: nondistended skin: No rash over the back or left shoulder, skin intact.  No hypersensitivity in the area of pain. Musculoskeletal: No C-spine, T-spine tenderness.  Positive tenderness, muscle spasm left rhomboid.  No scapular tenderness.  No upper trapezial tenderness.  Pain aggravated with abduction past 90 degrees.  Negative empty can, liftoff test.  No pain with shoulder forward flexion or extension.  Grip strength 5/5 and equal bilaterally.  Sensation and motor intact in the median/radial/ulnar distribution.  RP 2+. Neurologic: Alert & oriented x 3, no focal neuro deficits Psychiatric: Speech and behavior appropriate   ED Course   Medications - No data to display  No orders of the defined types were placed in this encounter.   No results found for this or any previous visit (from the past 24 hours). No results found.  ED Clinical Impression  1. Strain of left  rhomboid muscle   2. Muscle spasm      ED Assessment/Plan     Differential includes incoming shingles, rhomboid strain/sprain/spasm.  Favor the latter at this point in time.  There is no evidence of shingles.  Discussed with patient that it is extraordinarily difficult to make the diagnosis of shingles until the rash shows up.  Will send home with 200-400 mg of ibuprofen combined with 1000 mg Tylenol 3 times a day, Zanaflex.  Advised stretching, heat.  Hesitant to provide preemptive prescription of Valtrex due to polycystic kidney disease although she denies chronic kidney disease, or steroids because if this is shingles, it could disseminate with the steroids.  Will provide primary care list for routine  care.  She is to return here if a rash shows up for reevaluation  Discussed  MDM, treatment plan, and plan for follow-up with patient. patient agrees with plan.   Meds ordered this encounter  Medications   tiZANidine (ZANAFLEX) 4 MG tablet    Sig: Take 1 tablet (4 mg total) by mouth every 8 (eight) hours as needed for muscle spasms.    Dispense:  30 tablet    Refill:  0      *This clinic note was created using Scientist, clinical (histocompatibility and immunogenetics). Therefore, there may be occasional mistakes despite careful proofreading.  ?    Domenick Gong, MD 10/23/23 [phone]

## 2023-10-23 NOTE — Discharge Instructions (Signed)
 200-400 mg of ibuprofen combined with 1000 mg of Tylenol 3 times a day, Zanaflex.  Zanaflex as a muscle relaxant.  Stretching, heat.  Return here if her rash shows up in the area of pain for reevaluation and we can prescribe Valtrex at that time.  Here is a list of primary care providers who are taking new patients:  [facility] [place] Dr. Joseph Berkshire (sports medicine) Dr. Elizabeth Sauer [address] [place] [address] [phone]  [facility] at [facility] [address] [place], [place] [address] [phone]  [facility] [place] [address]  [place] [address]  [phone]  [facility] [address] [place], [place] [address] [phone]  [facility] [address] [place]  [phone] [place], [place] [address]  Here are clinics/ other resources who will see you if you do not have insurance. Some have certain criteria that you must meet. Call them and find out what they are:  [facility]: [address], [place], [place] [address] Phone: [phone] Hours: First and Third Saturdays of each Month, 9 a.m. - 1 p.m.  [facility]: [address], [address] [place], [place] [address] Phone: [phone] Hours: Tuesday, 4 p.m. - 8 p.m. Thursday, 1 p.m. - 8 p.m. Wednesday, 9 a.m. - [facility] [address] [place], [place] [address] Phone: [phone] Pharmacy Phone Number: [phone] Dental Phone Number: [phone] [facility] Insurance Help: [phone]  Dental Hours: Monday - Thursday, 8 a.m. - 6 p.m.  [facility] [address] [place], [place] [address] Phone: [phone] Pharmacy Phone Number: [phone] [facility] Insurance Help: [phone]  [facility] [address] [place] [place], [place] [address] Phone: [phone] Pharmacy Phone Number: [phone] [facility] Insurance Help: [phone]  [facility] [address] [place], [place] [address] Phone: [phone] [facility] Insurance Help: [phone]   [facility] [address] [place], [place] [address] Phone: [phone]  Go to www.goodrx.com  or www.costplusdrugs.com to look up your medications. This will give you a list of where you can find your prescriptions at the most affordable prices. Or ask the pharmacist what the cash price is, or if they have any other discount programs available to help make your medication more affordable. This can be less expensive than what you would pay with insurance.

## 2023-11-28 ENCOUNTER — Ambulatory Visit: Admitting: Family Medicine

## 2023-12-06 ENCOUNTER — Ambulatory Visit (INDEPENDENT_AMBULATORY_CARE_PROVIDER_SITE_OTHER): Admitting: Family Medicine

## 2023-12-06 ENCOUNTER — Telehealth: Payer: Self-pay | Admitting: Family Medicine

## 2023-12-06 ENCOUNTER — Encounter: Payer: Self-pay | Admitting: Family Medicine

## 2023-12-06 ENCOUNTER — Other Ambulatory Visit: Payer: Self-pay | Admitting: Family Medicine

## 2023-12-06 VITALS — BP 145/78 | HR 72 | Temp 97.9°F | Resp 18 | Ht 59.0 in | Wt 158.0 lb

## 2023-12-06 DIAGNOSIS — R9389 Abnormal findings on diagnostic imaging of other specified body structures: Secondary | ICD-10-CM | POA: Diagnosis not present

## 2023-12-06 DIAGNOSIS — R82998 Other abnormal findings in urine: Secondary | ICD-10-CM

## 2023-12-06 DIAGNOSIS — I10 Essential (primary) hypertension: Secondary | ICD-10-CM | POA: Diagnosis not present

## 2023-12-06 DIAGNOSIS — Z90710 Acquired absence of both cervix and uterus: Secondary | ICD-10-CM

## 2023-12-06 DIAGNOSIS — E559 Vitamin D deficiency, unspecified: Secondary | ICD-10-CM

## 2023-12-06 DIAGNOSIS — R5383 Other fatigue: Secondary | ICD-10-CM

## 2023-12-06 LAB — POCT URINALYSIS DIP (CLINITEK)
Bilirubin, UA: NEGATIVE
Blood, UA: NEGATIVE
Glucose, UA: NEGATIVE mg/dL
Ketones, POC UA: NEGATIVE mg/dL
Nitrite, UA: NEGATIVE
Spec Grav, UA: 1.02 (ref 1.010–1.025)
Urobilinogen, UA: 0.2 U/dL
pH, UA: 6 (ref 5.0–8.0)

## 2023-12-06 MED ORDER — HYDROCHLOROTHIAZIDE 12.5 MG PO TABS: 12.5000 mg | ORAL_TABLET | Freq: Every day | ORAL | 3 refills | Status: DC

## 2023-12-06 NOTE — Telephone Encounter (Signed)
 Copied from CRM [id number]. Topic: Clinical - Lab/Test Results >> Dec 06, 2023  1:02 PM Geneva B wrote: Reason for CRM: patient is calling for clarification on lab work please call pt back

## 2023-12-06 NOTE — Assessment & Plan Note (Addendum)
 She is taking Cozaar  100 mg daily and is off the HCTZ.  Discussed starting HCTZ back at 12.5 mg daily.  CBC, CMP, thyroid panel, A1c, vitamin D  and lipid panel today.  Goal for cholesterol is 10-year ASCVD at 7.5% or less.

## 2023-12-06 NOTE — Telephone Encounter (Signed)
 Spoke with patient and advised she needs to restart hydrochlorothiazide .  Prescription for 12.5mg  hydrochlorothiazide  sent to [facility].

## 2023-12-06 NOTE — Progress Notes (Signed)
 New Patient Office Visit  Subjective    Patient ID: Lori Nguyen, female    DOB: 1956/05/04  Age: 68 y.o. MRN: [id number]  CC:  Chief Complaint  Patient presents with   Establish Care    HPI Lori Nguyen presents to establish care [name] 68 year old with HTN, GERD, PCKD, tendinitis of the thumb, Hx kidney stones and s/p total hysterectomy (07/05/2017). She has seen several doctors about the tendinitis in her thumb and they have given her a brace to wear.  She does not find the brace to be very effective.  She does not want to go back to see the hand doctor.  Like today, she frequently has swelling over the MCP and PIP joints. Her GERD is going well and she is not having any abdominal pain or reflux.  She takes pantoprazole  40 mg daily. She complains of being fatigued She is taking losartan  100 mg daily for her blood pressure.  She had a prescription for HCTZ but is no longer using that.  Blood pressure is mildly elevated in the office today at 145/78. She denies any problems or concerns today.  PHQ-9 is 1 and GAD-7 is 0   Outpatient Encounter Medications as of 12/06/2023  Medication Sig   aspirin  EC 81 MG tablet Take 81 mg by mouth daily.   Cholecalciferol  (VITAMIN D3 PO) Take 1 tablet by mouth daily.   losartan  (COZAAR ) 100 MG tablet    meclizine  (ANTIVERT ) 25 MG tablet Take 1 tablet (25 mg total) by mouth 3 (three) times daily as needed for dizziness.   pantoprazole  (PROTONIX ) 40 MG tablet Take 40 mg by mouth daily.    potassium chloride  (K-DUR) 10 MEQ tablet Take 10 mEq by mouth daily.    tiZANidine  (ZANAFLEX ) 4 MG tablet Take 1 tablet (4 mg total) by mouth every 8 (eight) hours as needed for muscle spasms.   hydrochlorothiazide  (HYDRODIURIL ) 25 MG tablet  (Patient not taking: Reported on 12/06/2023)   [DISCONTINUED] fluticasone (FLONASE) 50 MCG/ACT nasal spray Place into both nostrils. (Patient not taking: Reported on 12/06/2023)   [DISCONTINUED] terbinafine (LAMISIL) 250 MG  tablet TAKE 1 TABLET BY ORAL ROUTE EVERY DAY FOR TOE NAIL FUNGUS (Patient not taking: Reported on 12/06/2023)   No facility-administered encounter medications on file as of 12/06/2023.    Past Medical History:  Diagnosis Date   Anemia    Arthritis    Diabetes mellitus without complication (HCC)    gestational diabetes in 1990   GERD (gastroesophageal reflux disease)    Hypertension    Polycystic kidney    Pre-diabetes    Vaginal bleeding    Vitamin D  deficiency     Past Surgical History:  Procedure Laterality Date   BREAST EXCISIONAL BIOPSY Left 2001   benign   BREAST SURGERY Left    Breast Biopsy   CESAREAN SECTION  1990   COLONOSCOPY     COLONOSCOPY WITH PROPOFOL  N/A 05/03/2017   Procedure: COLONOSCOPY WITH PROPOFOL ;  Surgeon: Luke Salaam, MD;  Location: [facility] ENDOSCOPY;  Service: Gastroenterology;  Laterality: N/A;   COLONOSCOPY WITH PROPOFOL  N/A 06/15/2020   Procedure: COLONOSCOPY WITH PROPOFOL ;  Surgeon: Luke Salaam, MD;  Location: [facility] ENDOSCOPY;  Service: Gastroenterology;  Laterality: N/A;   CYSTOSCOPY  07/05/2017   Procedure: CYSTOSCOPY;  Surgeon: Alben Alma, MD;  Location: [facility] ORS;  Service: Gynecology;;   DILATION AND CURETTAGE OF UTERUS N/A 05/17/2017   unable to be done due to cervical stenosis   HIP SURGERY  history of R hip repair   JOINT REPLACEMENT Right January 2016   BIL Hip Replacement, Dr. Mozell Arias   TONSILLECTOMY     TOTAL HIP ARTHROPLASTY Left 06/15/2015   Procedure: TOTAL HIP ARTHROPLASTY ANTERIOR APPROACH;  Surgeon: Molli Angelucci, MD;  Location: [facility] ORS;  Service: Orthopedics;  Laterality: Left;   TOTAL LAPAROSCOPIC HYSTERECTOMY WITH SALPINGECTOMY N/A 07/05/2017   Procedure: TOTAL LAPAROSCOPIC HYSTERECTOMY, BSO;  Surgeon: Alben Alma, MD;  Location: [facility] ORS;  Service: Gynecology;  Laterality: N/A;   TUBAL LIGATION      Family History  Problem Relation Age of Onset   Hypertension Mother    Lung cancer Father    Lung cancer Sister     Breast cancer Neg Hx    Bladder Cancer Neg Hx    Kidney cancer Neg Hx     Social History   Socioeconomic History   Marital status: Married    Spouse name: Not on file   Number of children: Not on file   Years of education: Not on file   Highest education level: Not on file  Occupational History   Not on file  Tobacco Use   Smoking status: Never    Passive exposure: Never   Smokeless tobacco: Never  Vaping Use   Vaping status: Never Used  Substance and Sexual Activity   Alcohol use: Yes    Comment: rare   Drug use: No   Sexual activity: Not Currently    Birth control/protection: Post-menopausal  Other Topics Concern   Not on file  Social History Narrative   Not on file   Social Drivers of Health   Financial Resource Strain: Not on file  Food Insecurity: Not on file  Transportation Needs: Not on file  Physical Activity: Not on file  Stress: Not on file  Social Connections: Not on file  Intimate Partner Violence: Not on file    ROS      Objective   BP (!) 145/78 (BP Location: Left Arm, Patient Position: Sitting)   Pulse 72   Temp 97.9 F (36.6 C) (Oral)   Resp 18   Ht 4\' 11"  (1.499 m)   Wt 158 lb (71.7 kg)   SpO2 94%   BMI 31.91 kg/m    Physical Exam Vitals and nursing note reviewed.  Constitutional:      Appearance: Normal appearance.  HENT:     Head: Normocephalic and atraumatic.  Eyes:     Conjunctiva/sclera: Conjunctivae normal.  Cardiovascular:     Rate and Rhythm: Normal rate and regular rhythm.  Pulmonary:     Effort: Pulmonary effort is normal.     Breath sounds: Normal breath sounds.  Musculoskeletal:     Right lower leg: No edema.     Left lower leg: No edema.  Skin:    General: Skin is warm and dry.  Neurological:     Mental Status: She is alert and oriented to person, place, and time.  Psychiatric:        Mood and Affect: Mood normal.        Behavior: Behavior normal.        Thought Content: Thought content normal.         Judgment: Judgment normal.            The ASCVD Risk score (Arnett DK, et al., 2019) failed to calculate for the following reasons:   Cannot find a previous HDL lab   Cannot find a previous total cholesterol lab  Assessment & Plan:  Primary hypertension Assessment & Plan: She is taking Cozaar  100 mg daily and is off the HCTZ.  Discussed starting HCTZ back at 12.5 mg daily.  CBC, CMP, thyroid panel, A1c, vitamin D  and lipid panel today.  Goal for cholesterol is 10-year ASCVD at 7.5% or less.  Orders: -     Lipid panel -     CBC with Differential/Platelet -     Comprehensive metabolic panel with GFR -     Hemoglobin A1c -     Microalbumin / creatinine urine ratio  Other fatigue -     TSH + free T4 -     POCT URINALYSIS DIP (CLINITEK)  Endometrial thickening on ultrasound  Leukocytes in urine -     Urine Culture  Vitamin D  deficiency -     VITAMIN D  25 Hydroxy (Vit-D Deficiency, Fractures)  S/P hysterectomy    Return in about 3 months (around 03/07/2024).   Waldo Damian K Shannen [place], MD

## 2023-12-07 LAB — LIPID PANEL
Chol/HDL Ratio: 2 ratio (ref 0.0–4.4)
Cholesterol, Total: 203 mg/dL — ABNORMAL HIGH (ref 100–199)
HDL: 102 mg/dL (ref >39)
LDL Chol Calc (NIH): 90 mg/dL (ref 0–99)
Triglycerides: 60 mg/dL (ref 0–149)
VLDL Cholesterol Cal: 11 mg/dL (ref 5–40)

## 2023-12-07 LAB — CBC WITH DIFFERENTIAL/PLATELET
Basophils Absolute: 0 10*3/uL (ref 0.0–0.2)
Basos: 0 %
EOS (ABSOLUTE): 0.9 10*3/uL — ABNORMAL HIGH (ref 0.0–0.4)
Eos: 11 %
Hematocrit: 37.1 % (ref 34.0–46.6)
Hemoglobin: 12.3 g/dL (ref 11.1–15.9)
Immature Grans (Abs): 0 10*3/uL (ref 0.0–0.1)
Immature Granulocytes: 0 %
Lymphocytes Absolute: 1.9 10*3/uL (ref 0.7–3.1)
Lymphs: 23 %
MCH: 28.2 pg (ref 26.6–33.0)
MCHC: 33.2 g/dL (ref 31.5–35.7)
MCV: 85 fL (ref 79–97)
Monocytes Absolute: 0.6 10*3/uL (ref 0.1–0.9)
Monocytes: 7 %
Neutrophils Absolute: 4.8 10*3/uL (ref 1.4–7.0)
Neutrophils: 59 %
Platelets: 182 10*3/uL (ref 150–450)
RBC: 4.36 x10E6/uL (ref 3.77–5.28)
RDW: 12.7 % (ref 11.7–15.4)
WBC: 8.2 10*3/uL (ref 3.4–10.8)

## 2023-12-07 LAB — COMPREHENSIVE METABOLIC PANEL WITH GFR
ALT: 14 IU/L (ref 0–32)
AST: 21 IU/L (ref 0–40)
Albumin: 4.4 g/dL (ref 3.9–4.9)
Alkaline Phosphatase: 86 IU/L (ref 44–121)
BUN/Creatinine Ratio: 15 (ref 12–28)
BUN: 13 mg/dL (ref 8–27)
Bilirubin Total: 0.9 mg/dL (ref 0.0–1.2)
CO2: 20 mmol/L (ref 20–29)
Calcium: 9.5 mg/dL (ref 8.7–10.3)
Chloride: 106 mmol/L (ref 96–106)
Creatinine, Ser: 0.87 mg/dL (ref 0.57–1.00)
Globulin, Total: 2.4 g/dL (ref 1.5–4.5)
Glucose: 94 mg/dL (ref 70–99)
Potassium: 3.8 mmol/L (ref 3.5–5.2)
Sodium: 142 mmol/L (ref 134–144)
Total Protein: 6.8 g/dL (ref 6.0–8.5)
eGFR: 73 mL/min/{1.73_m2} (ref >59)

## 2023-12-07 LAB — HEMOGLOBIN A1C
Est. average glucose Bld gHb Est-mCnc: 117 mg/dL
Hgb A1c MFr Bld: 5.7 % — ABNORMAL HIGH (ref 4.8–5.6)

## 2023-12-07 LAB — MICROALBUMIN / CREATININE URINE RATIO
Creatinine, Urine: 61.1 mg/dL
Microalb/Creat Ratio: 102 mg/g{creat} — ABNORMAL HIGH (ref 0–29)
Microalbumin, Urine: 62.1 ug/mL

## 2023-12-07 LAB — TSH+FREE T4
Free T4: 1.01 ng/dL (ref 0.82–1.77)
TSH: 1.3 u[IU]/mL (ref 0.450–4.500)

## 2023-12-10 ENCOUNTER — Encounter: Payer: Self-pay | Admitting: Family Medicine

## 2023-12-10 LAB — URINE CULTURE

## 2023-12-10 LAB — VITAMIN D 25 HYDROXY (VIT D DEFICIENCY, FRACTURES): Vit D, 25-Hydroxy: 57.3 ng/mL (ref 30.0–100.0)

## 2023-12-10 LAB — SPECIMEN STATUS REPORT

## 2023-12-12 ENCOUNTER — Ambulatory Visit: Payer: Self-pay | Admitting: Family Medicine

## 2024-01-15 ENCOUNTER — Other Ambulatory Visit: Payer: Self-pay | Admitting: Family Medicine

## 2024-01-15 ENCOUNTER — Telehealth: Payer: Self-pay | Admitting: Family Medicine

## 2024-01-15 MED ORDER — POTASSIUM CHLORIDE ER 10 MEQ PO TBCR: 10.0000 meq | EXTENDED_RELEASE_TABLET | Freq: Every day | ORAL | 1 refills | Status: AC

## 2024-01-15 MED ORDER — POTASSIUM CHLORIDE ER 10 MEQ PO TBCR: 10.0000 meq | EXTENDED_RELEASE_TABLET | Freq: Every day | ORAL | 1 refills | Status: DC

## 2024-01-15 NOTE — Telephone Encounter (Signed)
 Sent to [facility] in [place] per patient request

## 2024-01-15 NOTE — Telephone Encounter (Signed)
 Copied from CRM [id number]. Topic: Clinical - Medication Refill >> Jan 15, 2024 11:46 AM Emylou G wrote: Medication: potassium chloride  (K-DUR) 10 MEQ tablet  Has the patient contacted their pharmacy? No (Agent: If no, request that the patient contact the pharmacy for the refill. If patient does not wish to contact the pharmacy document the reason why and proceed with request.) (Agent: If yes, when and what did the pharmacy advise?)  This is the patient's preferred pharmacy:   [facility] [place] [place] - [address] [place] [address] Phone: [phone] Fax: [phone]  Is this the correct pharmacy for this prescription? Yes If no, delete pharmacy and type the correct one.   Has the prescription been filled recently? No  Is the patient out of the medication? Yes  Has the patient been seen for an appointment in the last year OR does the patient have an upcoming appointment? Yes  Can we respond through MyChart? Yes  Agent: Please be advised that Rx refills may take up to 3 business days. We ask that you follow-up with your pharmacy.

## 2024-01-15 NOTE — Telephone Encounter (Signed)
 Copied from CRM [id number]. Topic: General - Other >> Jan 15, 2024 11:48 AM Emylou G wrote: Reason for CRM: Patient called.. put in for refill.. but she was wanting to get samples potassium chloride  (K-DUR) 10 MEQ tablet?  She is out of medication

## 2024-02-26 ENCOUNTER — Telehealth: Payer: Self-pay

## 2024-02-26 NOTE — Telephone Encounter (Signed)
 Attempted to contact patient to schedule Annual Medicare Wellness Visit. Left a message to call and schedule AWV when able.

## 2024-03-07 ENCOUNTER — Encounter: Payer: Self-pay | Admitting: Family Medicine

## 2024-03-07 ENCOUNTER — Ambulatory Visit: Admitting: Family Medicine

## 2024-03-07 VITALS — BP 171/92 | HR 76 | Temp 98.6°F | Resp 18 | Ht 59.0 in | Wt 158.0 lb

## 2024-03-07 DIAGNOSIS — E559 Vitamin D deficiency, unspecified: Secondary | ICD-10-CM | POA: Diagnosis not present

## 2024-03-07 DIAGNOSIS — I10 Essential (primary) hypertension: Secondary | ICD-10-CM | POA: Diagnosis not present

## 2024-03-07 DIAGNOSIS — Q613 Polycystic kidney, unspecified: Secondary | ICD-10-CM | POA: Diagnosis not present

## 2024-03-07 DIAGNOSIS — M170 Bilateral primary osteoarthritis of knee: Secondary | ICD-10-CM | POA: Diagnosis not present

## 2024-03-07 DIAGNOSIS — K219 Gastro-esophageal reflux disease without esophagitis: Secondary | ICD-10-CM

## 2024-03-07 LAB — POCT GLYCOSYLATED HEMOGLOBIN (HGB A1C): Hemoglobin A1C: 5.4 % (ref 4.0–5.6)

## 2024-03-07 MED ORDER — PANTOPRAZOLE SODIUM 40 MG PO TBEC: 40.0000 mg | DELAYED_RELEASE_TABLET | Freq: Every day | ORAL | 1 refills | Status: DC

## 2024-03-07 MED ORDER — PANTOPRAZOLE SODIUM 40 MG PO TBEC: 40.0000 mg | DELAYED_RELEASE_TABLET | Freq: Every day | ORAL | 1 refills | Status: AC

## 2024-03-07 NOTE — Progress Notes (Signed)
 Established Patient Office Visit  Subjective   Patient ID: Lori Nguyen, female    DOB: 04-May-1956  Age: 68 y.o. MRN: [id number]  Chief Complaint  Patient presents with   Medical Management of Chronic Issues    HPI [name] 67-yo with HTN, GERD, PCKD, tendinitis of the thumb, severe OA knees bilateral, Hx kidney stones and s/p bilateral hip replaceents, s/p total hysterectomy (07/05/2017) colonoscopy 06/15/2020 (repeat in 5 years).  Her blood pressure is elevated at 171/97.  She has not taken her blood pressure medications today.  Typically she takes hydrochlorothiazide  12.5, potassium chloride  10 mEq and Cozaar  100 mg daily.  She has a blood pressure cuff but is not in the habit of checking her blood pressure.  Encouraging her to check her blood pressure routinely and vitals were looking is less than 140/90.    She has PCKD and is followed by an nephrologist in [place].  She asked for a handicap placard today.  She is status post bilateral hip replacements and has severe OA of bilateral knees.  She has had steroid injections in her knees in the past but only lasted a day or 2 so she does not want to go back.  12/06/2023 A1c was 5.7%, total cholesterol 203, HDL 60, Triggs 102, LDL 90  She is a borderline diabetic so we will check her A1c today.  Advises that neither one of her parents had diabetes and none of her siblings.  She may have had a grandmother that had diabetes though.  She is doing well with GERD and is on pantoprazole  40 mg daily.  She is due a mammogram.    Objective:     BP (!) 171/92   Pulse 76   Temp 98.6 F (37 C) (Oral)   Resp 18   Ht 4' 11 (1.499 m)   Wt 158 lb (71.7 kg)   SpO2 95%   BMI 31.91 kg/m    Physical Exam Vitals and nursing note reviewed.  Constitutional:      Appearance: Normal appearance.  HENT:     Head: Normocephalic and atraumatic.  Eyes:     Conjunctiva/sclera: Conjunctivae normal.  Cardiovascular:     Rate and Rhythm:  Normal rate and regular rhythm.  Pulmonary:     Effort: Pulmonary effort is normal.     Breath sounds: Normal breath sounds.  Musculoskeletal:        General: Swelling (both knees have effusions) present.     Right lower leg: No edema.     Left lower leg: No edema.  Skin:    General: Skin is warm and dry.  Neurological:     Mental Status: She is alert and oriented to person, place, and time.  Psychiatric:        Mood and Affect: Mood normal.        Behavior: Behavior normal.        Thought Content: Thought content normal.        Judgment: Judgment normal.          Results for orders placed or performed in visit on 03/07/24  CBC with Differential/Platelet  Result Value Ref Range   WBC 6.0 3.4 - 10.8 x10E3/uL   RBC 4.31 3.77 - 5.28 x10E6/uL   Hemoglobin 12.2 11.1 - 15.9 g/dL   Hematocrit 62.0 65.9 - 46.6 %   MCV 88 79 - 97 fL   MCH 28.3 26.6 - 33.0 pg   MCHC 32.2 31.5 - 35.7 g/dL  RDW 13.0 11.7 - 15.4 %   Platelets 165 150 - 450 x10E3/uL   Neutrophils 66 Not Estab. %   Lymphs 22 Not Estab. %   Monocytes 6 Not Estab. %   Eos 5 Not Estab. %   Basos 1 Not Estab. %   Neutrophils Absolute 3.9 1.4 - 7.0 x10E3/uL   Lymphocytes Absolute 1.3 0.7 - 3.1 x10E3/uL   Monocytes Absolute 0.3 0.1 - 0.9 x10E3/uL   EOS (ABSOLUTE) 0.3 0.0 - 0.4 x10E3/uL   Basophils Absolute 0.0 0.0 - 0.2 x10E3/uL   Immature Granulocytes 0 Not Estab. %   Immature Grans (Abs) 0.0 0.0 - 0.1 x10E3/uL  Comprehensive metabolic panel with GFR  Result Value Ref Range   Glucose 96 70 - 99 mg/dL   BUN 12 8 - 27 mg/dL   Creatinine, Ser 9.15 0.57 - 1.00 mg/dL   eGFR 77 >40 fO/fpw/8.26   BUN/Creatinine Ratio 14 12 - 28   Sodium 143 134 - 144 mmol/L   Potassium 4.0 3.5 - 5.2 mmol/L   Chloride 106 96 - 106 mmol/L   CO2 22 20 - 29 mmol/L   Calcium 9.7 8.7 - 10.3 mg/dL   Total Protein 6.5 6.0 - 8.5 g/dL   Albumin 4.3 3.9 - 4.9 g/dL   Globulin, Total 2.2 1.5 - 4.5 g/dL   Bilirubin Total 0.7 0.0 - 1.2 mg/dL    Alkaline Phosphatase 86 44 - 121 IU/L   AST 22 0 - 40 IU/L   ALT 14 0 - 32 IU/L  Lipid panel  Result Value Ref Range   Cholesterol, Total 209 (H) 100 - 199 mg/dL   Triglycerides 77 0 - 149 mg/dL   HDL 95 >60 mg/dL   VLDL Cholesterol Cal 14 5 - 40 mg/dL   LDL Chol Calc (NIH) 899 (H) 0 - 99 mg/dL   Chol/HDL Ratio 2.2 0.0 - 4.4 ratio  TSH + free T4  Result Value Ref Range   TSH 1.570 0.450 - 4.500 uIU/mL   Free T4 0.98 0.82 - 1.77 ng/dL  Vitamin D  (25 hydroxy)  Result Value Ref Range   Vit D, 25-Hydroxy 46.4 30.0 - 100.0 ng/mL  POCT glycosylated hemoglobin (Hb A1C)  Result Value Ref Range   Hemoglobin A1C 5.4 4.0 - 5.6 %   HbA1c POC (<> result, manual entry)     HbA1c, POC (prediabetic range)     HbA1c, POC (controlled diabetic range)        The 10-year ASCVD risk score (Arnett DK, et al., 2019) is: 36.6%    Assessment & Plan:  Polycystic kidney disease -     Comprehensive metabolic panel with GFR  Primary hypertension Assessment & Plan: She is not taking her blood pressure medication today.  Encouraging her to be more compliant.  Checking her potassium level today.  As she takes HCTZ 12.5 mg daily and losartan  100 mg daily.  Orders: -     POCT glycosylated hemoglobin (Hb A1C) -     CBC with Differential/Platelet -     Lipid panel -     TSH + free T4  Gastroesophageal reflux disease, unspecified whether esophagitis present -     Pantoprazole  Sodium; Take 1 tablet (40 mg total) by mouth daily.  Dispense: 90 tablet; Refill: 1  Vitamin D  deficiency -     VITAMIN D  25 Hydroxy (Vit-D Deficiency, Fractures)  Primary osteoarthritis of both knees Assessment & Plan: Has osteoarthritis of the knees.  Gave her a handicap placard.  Return in about 3 months (around 06/07/2024).    Herberta Pickron K Yosef Krogh, MD

## 2024-03-08 LAB — CBC WITH DIFFERENTIAL/PLATELET
Basophils Absolute: 0 x10E3/uL (ref 0.0–0.2)
Basos: 1 %
EOS (ABSOLUTE): 0.3 x10E3/uL (ref 0.0–0.4)
Eos: 5 %
Hematocrit: 37.9 % (ref 34.0–46.6)
Hemoglobin: 12.2 g/dL (ref 11.1–15.9)
Immature Grans (Abs): 0 x10E3/uL (ref 0.0–0.1)
Immature Granulocytes: 0 %
Lymphocytes Absolute: 1.3 x10E3/uL (ref 0.7–3.1)
Lymphs: 22 %
MCH: 28.3 pg (ref 26.6–33.0)
MCHC: 32.2 g/dL (ref 31.5–35.7)
MCV: 88 fL (ref 79–97)
Monocytes Absolute: 0.3 x10E3/uL (ref 0.1–0.9)
Monocytes: 6 %
Neutrophils Absolute: 3.9 x10E3/uL (ref 1.4–7.0)
Neutrophils: 66 %
Platelets: 165 x10E3/uL (ref 150–450)
RBC: 4.31 x10E6/uL (ref 3.77–5.28)
RDW: 13 % (ref 11.7–15.4)
WBC: 6 x10E3/uL (ref 3.4–10.8)

## 2024-03-08 LAB — LIPID PANEL
Chol/HDL Ratio: 2.2 ratio (ref 0.0–4.4)
Cholesterol, Total: 209 mg/dL — ABNORMAL HIGH (ref 100–199)
HDL: 95 mg/dL (ref >39)
LDL Chol Calc (NIH): 100 mg/dL — ABNORMAL HIGH (ref 0–99)
Triglycerides: 77 mg/dL (ref 0–149)
VLDL Cholesterol Cal: 14 mg/dL (ref 5–40)

## 2024-03-08 LAB — COMPREHENSIVE METABOLIC PANEL WITH GFR
ALT: 14 IU/L (ref 0–32)
AST: 22 IU/L (ref 0–40)
Albumin: 4.3 g/dL (ref 3.9–4.9)
Alkaline Phosphatase: 86 IU/L (ref 44–121)
BUN/Creatinine Ratio: 14 (ref 12–28)
BUN: 12 mg/dL (ref 8–27)
Bilirubin Total: 0.7 mg/dL (ref 0.0–1.2)
CO2: 22 mmol/L (ref 20–29)
Calcium: 9.7 mg/dL (ref 8.7–10.3)
Chloride: 106 mmol/L (ref 96–106)
Creatinine, Ser: 0.84 mg/dL (ref 0.57–1.00)
Globulin, Total: 2.2 g/dL (ref 1.5–4.5)
Glucose: 96 mg/dL (ref 70–99)
Potassium: 4 mmol/L (ref 3.5–5.2)
Sodium: 143 mmol/L (ref 134–144)
Total Protein: 6.5 g/dL (ref 6.0–8.5)
eGFR: 77 mL/min/1.73 (ref >59)

## 2024-03-08 LAB — TSH+FREE T4
Free T4: 0.98 ng/dL (ref 0.82–1.77)
TSH: 1.57 u[IU]/mL (ref 0.450–4.500)

## 2024-03-08 LAB — VITAMIN D 25 HYDROXY (VIT D DEFICIENCY, FRACTURES): Vit D, 25-Hydroxy: 46.4 ng/mL (ref 30.0–100.0)

## 2024-03-10 ENCOUNTER — Ambulatory Visit: Payer: Self-pay | Admitting: Family Medicine

## 2024-03-31 DIAGNOSIS — M179 Osteoarthritis of knee, unspecified: Secondary | ICD-10-CM | POA: Insufficient documentation

## 2024-03-31 NOTE — Assessment & Plan Note (Signed)
 Has osteoarthritis of the knees.  Gave her a handicap placard.

## 2024-03-31 NOTE — Assessment & Plan Note (Signed)
 She is not taking her blood pressure medication today.  Encouraging her to be more compliant.  Checking her potassium level today.  As she takes HCTZ 12.5 mg daily and losartan  100 mg daily.

## 2024-04-04 ENCOUNTER — Encounter: Payer: Self-pay | Admitting: Family Medicine

## 2024-04-04 ENCOUNTER — Ambulatory Visit (INDEPENDENT_AMBULATORY_CARE_PROVIDER_SITE_OTHER): Admitting: Family Medicine

## 2024-04-04 VITALS — BP 149/87 | HR 62 | Temp 98.3°F | Resp 18 | Ht 59.0 in | Wt 158.0 lb

## 2024-04-04 DIAGNOSIS — Z23 Encounter for immunization: Secondary | ICD-10-CM | POA: Diagnosis not present

## 2024-04-04 DIAGNOSIS — I10 Essential (primary) hypertension: Secondary | ICD-10-CM

## 2024-04-04 MED ORDER — AMLODIPINE BESYLATE 5 MG PO TABS
5.0000 mg | ORAL_TABLET | Freq: Every day | ORAL | 1 refills | Status: AC
Start: 2024-04-04 — End: ?

## 2024-04-04 MED ORDER — LOSARTAN POTASSIUM 100 MG PO TABS
100.0000 mg | ORAL_TABLET | Freq: Every day | ORAL | 0 refills | Status: AC
Start: 2024-04-04 — End: ?

## 2024-04-04 NOTE — Assessment & Plan Note (Signed)
 Shingrix  Number 1 today.  Plan to give second one in 4 weeks.

## 2024-04-04 NOTE — Progress Notes (Signed)
   Established Patient Office Visit  Subjective   Patient ID: Lori Nguyen, female    DOB: 1956-06-24  Age: 68 y.o. MRN: [id number]  Chief Complaint  Patient presents with   Medical Management of Chronic Issues    HPI [name] 67-yo with HTN, GERD, PCKD, tendinitis of the thumb, severe OA knees bilateral, Hx kidney stones and s/p bilateral hip replaceents, s/p total hysterectomy (07/05/2017) colonoscopy 06/15/2020 (repeat in 5 years).   Discussed the use of AI scribe software for clinical note transcription with the patient, who gave verbal consent to proceed.  History of Present Illness   ELLER SWEIS is a 68 year old female with hypertension who presents for blood pressure management.  Her blood pressure readings at home have been consistently elevated, ranging from 160/60 to 168/77 mmHg. She notes that her blood pressure tends to be higher during doctor's visits, which she attributes to stress, particularly following her husband's passing. She is currently on losartan  100mg  and hydrochlorothiazide  (HCTZ) 12.5mg  for hypertension management.  She regularly monitors her blood pressure at home.  She is interested in receiving the shingles vaccine.    Objective:     BP (!) 149/87 (BP Location: Right Arm, Patient Position: Sitting, Cuff Size: Normal)   Pulse 62   Temp 98.3 F (36.8 C) (Oral)   Resp 18   Ht 4' 11 (1.499 m)   Wt 158 lb (71.7 kg)   SpO2 98%   BMI 31.91 kg/m    Physical Exam Vitals reviewed.  Constitutional:      Appearance: Normal appearance.  HENT:     Head: Normocephalic.  Eyes:     General:        Right eye: No discharge.        Left eye: No discharge.  Cardiovascular:     Rate and Rhythm: Normal rate.  Pulmonary:     Effort: Pulmonary effort is normal.  Neurological:     Mental Status: She is alert and oriented to person, place, and time.  Psychiatric:        Mood and Affect: Mood normal.        Behavior: Behavior normal.        Thought  Content: Thought content normal.        Judgment: Judgment normal.          No results found for any visits on 04/04/24.    The 10-year ASCVD risk score (Arnett DK, et al., 2019) is: 28.1%    Assessment & Plan:  Primary hypertension Assessment & Plan: Adding amlodipine  5mg  to hydrochlorothiazide  12.5 mg and Losartan  100 mg daily.  Please continue to take potassium chloride  10 meq daily.  FOLLOW-UP in a month.  Please continue to check your BP at home  Orders: -     Losartan  Potassium; Take 1 tablet (100 mg total) by mouth daily.  Dispense: 90 tablet; Refill: 0 -     amLODIPine  Besylate; Take 1 tablet (5 mg total) by mouth daily.  Dispense: 90 tablet; Refill: 1  Immunization due Assessment & Plan: Shingrix  Number 1 today.  Plan to give second one in 4 weeks.    Orders: -     Varicella-zoster vaccine IM     Return in about 4 weeks (around 05/02/2024).    Daichi Moris K Lakia Gritton, MD

## 2024-04-04 NOTE — Assessment & Plan Note (Signed)
 Adding amlodipine  5mg  to hydrochlorothiazide  12.5 mg and Losartan  100 mg daily.  Please continue to take potassium chloride  10 meq daily.  FOLLOW-UP in a month.  Please continue to check your BP at home

## 2024-05-02 ENCOUNTER — Ambulatory Visit (INDEPENDENT_AMBULATORY_CARE_PROVIDER_SITE_OTHER): Admitting: Family Medicine

## 2024-05-02 ENCOUNTER — Telehealth: Payer: Self-pay

## 2024-05-02 ENCOUNTER — Encounter: Payer: Self-pay | Admitting: Family Medicine

## 2024-05-02 VITALS — BP 143/82 | HR 68 | Temp 98.1°F | Resp 18 | Ht 59.0 in | Wt 161.0 lb

## 2024-05-02 DIAGNOSIS — E119 Type 2 diabetes mellitus without complications: Secondary | ICD-10-CM | POA: Insufficient documentation

## 2024-05-02 DIAGNOSIS — E782 Mixed hyperlipidemia: Secondary | ICD-10-CM

## 2024-05-02 DIAGNOSIS — I10 Essential (primary) hypertension: Secondary | ICD-10-CM

## 2024-05-02 NOTE — Progress Notes (Signed)
 Established Patient Office Visit  Subjective   Patient ID: Lori Nguyen, female    DOB: Aug 21, 1955  Age: 68 y.o. MRN: [id number]  Chief Complaint  Patient presents with   Medical Management of Chronic Issues    HPI Discussed the use of AI scribe software for clinical note transcription with the patient, who gave verbal consent to proceed.  History of Present Illness  Lori Nguyen 68-yo with HTN, GERD, PCKD, tendinitis of the thumb, severe OA knees bilateral, Hx kidney stones and s/p bilateral hip replaceents, s/p total hysterectomy (07/05/2017) colonoscopy 06/15/2020 (repeat in 5 years).   She has been monitoring her blood pressure at home, with readings around 135/70's over 122/70's. Which she describes as 'running good.' She is currently taking losartan  100 mg, hydrochlorothiazide  12.5 mg, and amlodipine  5 mg daily. She also takes potassium chloride  10 meq daily.  She is tolerating her BP medications well and has no complaints.    She experiences increased urination, which she attributes to the hydrochlorothiazide , and notes feeling thirsty as a result.  She received a flu shot at work earlier this week and experienced only mild tenderness at the injection site, which has since resolved.  Calculated her ten year risk of CAD/CVA and it is 35%.  Discussed cholesterol lowering medication.    Objective:     BP (!) 143/82 (BP Location: Left Arm, Patient Position: Sitting, Cuff Size: Normal)   Pulse 68   Temp 98.1 F (36.7 C) (Oral)   Resp 18   Ht 4' 11 (1.499 m)   Wt 161 lb (73 kg)   SpO2 98%   BMI 32.52 kg/m    Physical Exam Vitals and nursing note reviewed.  Constitutional:      Appearance: Normal appearance.  HENT:     Head: Normocephalic and atraumatic.  Eyes:     Conjunctiva/sclera: Conjunctivae normal.  Cardiovascular:     Rate and Rhythm: Normal rate and regular rhythm.  Pulmonary:     Effort: Pulmonary effort is normal.     Breath sounds: Normal breath sounds.   Musculoskeletal:     Right lower leg: No edema.     Left lower leg: No edema.  Skin:    General: Skin is warm and dry.  Neurological:     Mental Status: She is alert and oriented to person, place, and time.  Psychiatric:        Mood and Affect: Mood normal.        Behavior: Behavior normal.        Thought Content: Thought content normal.        Judgment: Judgment normal.          No results found for any visits on 05/02/24.    The 10-year ASCVD risk score (Arnett DK, et al., 2019) is: 27.8%    Assessment & Plan:  Essential hypertension Assessment & Plan: She reports that her BP is running in the 120-130's over 70's.  She is on hydrochlorothiazide  12.5mg , norvasc  5mg  and losartan  100mg  daily   Mixed hyperlipidemia Assessment & Plan: 03/07/2024 total cholesterol 209, trig 77, HDL 95 and LDL 100.  Despite her incredibly good HDL her 10-year ASCVD risk is 27%.  Discussed taking a cholesterol medication and she is not interested.    Assessment and Plan    Hypertension Hypertension is well-controlled with current medication regimen. Blood pressure readings at home are consistently around 135/122 over 70/72. No significant side effects from medications, though increased urination noted due to hydrochlorothiazide .  -  Continue losartan  100 mg daily. - Continue hydrochlorothiazide  12.5 mg daily. - Continue amlodipine  5 mg daily. - Continue potassium chloride  daily.      Essential hypertension Assessment & Plan: She reports that her BP is running in the 120-130's over 70's.  She is on hydrochlorothiazide  12.5mg , norvasc  5mg  and losartan  100mg  daily  Follow-up: First week in November    Haylea Schlichting K Jillien Yakel, MD

## 2024-05-02 NOTE — Telephone Encounter (Signed)
 Was unable to reach patient regarding message requesting to reschedule appointment.

## 2024-05-02 NOTE — Assessment & Plan Note (Signed)
 03/07/2024 total cholesterol 209, trig 77, HDL 95 and LDL 100.  Despite her incredibly good HDL her 10-year ASCVD risk is 27%.  Discussed taking a cholesterol medication and she is not interested.

## 2024-05-02 NOTE — Telephone Encounter (Unsigned)
 Copied from CRM #[id number]. Topic: Appointments - Appointment Scheduling >> May 02, 2024 12:53 PM Delon T wrote: Patient is stuck at eye doctor, needs to reschedule for shingles shot, please call [phone]

## 2024-05-02 NOTE — Assessment & Plan Note (Signed)
 She reports that her BP is running in the 120-130's over 70's.  She is on hydrochlorothiazide  12.5mg , norvasc  5mg  and losartan  100mg  daily

## 2024-05-28 LAB — OPHTHALMOLOGY REPORT-SCANNED

## 2024-06-05 ENCOUNTER — Other Ambulatory Visit: Payer: Self-pay | Admitting: Family Medicine

## 2024-06-05 DIAGNOSIS — I10 Essential (primary) hypertension: Secondary | ICD-10-CM

## 2024-06-09 ENCOUNTER — Encounter: Payer: Self-pay | Admitting: Family Medicine

## 2024-06-09 ENCOUNTER — Ambulatory Visit (INDEPENDENT_AMBULATORY_CARE_PROVIDER_SITE_OTHER): Admitting: Family Medicine

## 2024-06-09 VITALS — BP 145/82 | HR 71 | Temp 98.3°F | Resp 18 | Ht 59.0 in | Wt 162.0 lb

## 2024-06-09 DIAGNOSIS — Z23 Encounter for immunization: Secondary | ICD-10-CM

## 2024-06-09 DIAGNOSIS — E782 Mixed hyperlipidemia: Secondary | ICD-10-CM | POA: Diagnosis not present

## 2024-06-09 DIAGNOSIS — E119 Type 2 diabetes mellitus without complications: Secondary | ICD-10-CM

## 2024-06-09 DIAGNOSIS — Z1231 Encounter for screening mammogram for malignant neoplasm of breast: Secondary | ICD-10-CM

## 2024-06-09 DIAGNOSIS — I10 Essential (primary) hypertension: Secondary | ICD-10-CM | POA: Diagnosis not present

## 2024-06-09 DIAGNOSIS — N281 Cyst of kidney, acquired: Secondary | ICD-10-CM

## 2024-06-09 LAB — POCT GLYCOSYLATED HEMOGLOBIN (HGB A1C): Hemoglobin A1C: 5.5 % (ref 4.0–5.6)

## 2024-06-09 MED ORDER — HYDROCHLOROTHIAZIDE 25 MG PO TABS: 25.0000 mg | ORAL_TABLET | Freq: Every day | ORAL | 3 refills | Status: AC

## 2024-06-09 NOTE — Assessment & Plan Note (Signed)
 Shingrix  No. 2 today.

## 2024-06-09 NOTE — Assessment & Plan Note (Signed)
 Her 10-year ASCVD risk is roughly 28+ %.  Have encouraged her to start a statin medication and she is hesitant.  Please discuss this with your nephrologist.

## 2024-06-09 NOTE — Progress Notes (Signed)
 Established Patient Office Visit  Subjective   Patient ID: Lori Nguyen, female    DOB: 1956-01-21  Age: 68 y.o. MRN: [id number]  Chief Complaint  Patient presents with   Medical Management of Chronic Issues    fasting    HPI Discussed the use of AI scribe software for clinical note transcription with the patient, who gave verbal consent to proceed.  History of Present Illness   Lori Nguyen is a [name] 68 year old with HTN, GERD, PCKD, tendinitis of the thumb, Hx kidney stones and s/p total hysterectomy (07/05/2017).   Her medical history includes hypertension, for which she takes losartan  100 mg, hydrochlorothiazide  12.5 mg, and amlodipine  5 mg daily, along with potassium chloride  10meq daily.. She reports stable blood pressure with home readings ranging from 127 to 142 systolic.   No lightheadedness or dizziness upon standing. No swelling in her legs.  She has elevated cholesterol and 10 year ASCVD of 28%.  She is hesitant to take a statin medication.   She has an upcoming appointment with her kidney specialist in January. Asked her to discuss statin therapy with him.   She has not had a mammogram in the past year and is interested in scheduling one at [place], near the hospital.  She needs her second Shingrix  today.    She works in clinical biochemist, dealing with specimens, and plans to work on Thanksgiving Day for financial reasons.     ROS    Objective:     BP (!) 145/82 (BP Location: Right Arm, Patient Position: Sitting, Cuff Size: Normal)   Pulse 71   Temp 98.3 F (36.8 C) (Oral)   Resp 18   Ht 4' 11 (1.499 m)   Wt 162 lb (73.5 kg)   SpO2 95%   BMI 32.72 kg/m    Physical Exam Vitals and nursing note reviewed.  Constitutional:      Appearance: Normal appearance.  HENT:     Head: Normocephalic and atraumatic.  Eyes:     Conjunctiva/sclera: Conjunctivae normal.  Cardiovascular:     Rate and Rhythm: Normal rate and regular rhythm.  Pulmonary:      Effort: Pulmonary effort is normal.     Breath sounds: Normal breath sounds.  Musculoskeletal:     Right lower leg: No edema.     Left lower leg: No edema.  Skin:    General: Skin is warm and dry.  Neurological:     Mental Status: She is alert and oriented to person, place, and time.  Psychiatric:        Mood and Affect: Mood normal.        Behavior: Behavior normal.        Thought Content: Thought content normal.        Judgment: Judgment normal.          Results for orders placed or performed in visit on 06/09/24  POCT glycosylated hemoglobin (Hb A1C)  Result Value Ref Range   Hemoglobin A1C 5.5 4.0 - 5.6 %   HbA1c POC (<> result, manual entry)     HbA1c, POC (prediabetic range)     HbA1c, POC (controlled diabetic range)        The 10-year ASCVD risk score (Arnett DK, et al., 2019) is: 28.6%    Assessment & Plan:  Immunization due Assessment & Plan: Shingrix  No. 2 today.  Orders: -     Varicella-zoster vaccine IM  Type 2 diabetes mellitus without complication, without long-term current use of  insulin [facility]) Assessment & Plan: She is doing excellent with her diabetes.  Her A1c is 5.5% today.    Orders: -     POCT glycosylated hemoglobin (Hb A1C)  Primary hypertension -     hydroCHLOROthiazide ; Take 1 tablet (25 mg total) by mouth daily.  Dispense: 90 tablet; Refill: 3 -     CBC with Differential/Platelet -     Comprehensive metabolic panel with GFR -     TSH -     T4, free  Mixed hyperlipidemia Assessment & Plan: Her 10-year ASCVD risk is roughly 28+ %.  Have encouraged her to start a statin medication and she is hesitant.  Please discuss this with your nephrologist.  Orders: -     Lipid panel  Encounter for screening mammogram for malignant neoplasm of breast -     MM 3D DIAGNOSTIC MAMMOGRAM BILATERAL BREAST; Future  Acquired polycystic kidney disease Assessment & Plan: Checking her renal function today.   Essential hypertension Assessment &  Plan: Blood pressure is mildly elevated in the 140s.  She takes losartan  100 mg daily, HCTZ 12.5 mg daily and amlodipine  5 mg daily along with potassium chloride  10 mEq daily.  Increase her HCTZ to 25 mg daily and recheck in 3 months.  Goal is systolic blood pressure 30 or less because she has polycystic kidney disease.                Return in about 3 months (around 09/09/2024).    Anuel Sitter K Demeka Sutter, MD

## 2024-06-09 NOTE — Assessment & Plan Note (Signed)
 Checking her renal function today.

## 2024-06-09 NOTE — Assessment & Plan Note (Signed)
 She is doing excellent with her diabetes.  Her A1c is 5.5% today.

## 2024-06-09 NOTE — Assessment & Plan Note (Signed)
 Blood pressure is mildly elevated in the 140s.  She takes losartan  100 mg daily, HCTZ 12.5 mg daily and amlodipine  5 mg daily along with potassium chloride  10 mEq daily.  Increase her HCTZ to 25 mg daily and recheck in 3 months.  Goal is systolic blood pressure 30 or less because she has polycystic kidney disease.

## 2024-06-10 LAB — TSH: TSH: 1.12 u[IU]/mL (ref 0.450–4.500)

## 2024-06-10 LAB — CBC WITH DIFFERENTIAL/PLATELET
Basophils Absolute: 0 x10E3/uL (ref 0.0–0.2)
Basos: 1 %
EOS (ABSOLUTE): 0.3 x10E3/uL (ref 0.0–0.4)
Eos: 6 %
Hematocrit: 36.3 % (ref 34.0–46.6)
Hemoglobin: 12.2 g/dL (ref 11.1–15.9)
Immature Grans (Abs): 0 x10E3/uL (ref 0.0–0.1)
Immature Granulocytes: 0 %
Lymphocytes Absolute: 1.5 x10E3/uL (ref 0.7–3.1)
Lymphs: 25 %
MCH: 28.9 pg (ref 26.6–33.0)
MCHC: 33.6 g/dL (ref 31.5–35.7)
MCV: 86 fL (ref 79–97)
Monocytes Absolute: 0.5 x10E3/uL (ref 0.1–0.9)
Monocytes: 8 %
Neutrophils Absolute: 3.5 x10E3/uL (ref 1.4–7.0)
Neutrophils: 60 %
Platelets: 176 x10E3/uL (ref 150–450)
RBC: 4.22 x10E6/uL (ref 3.77–5.28)
RDW: 12.9 % (ref 11.7–15.4)
WBC: 5.8 x10E3/uL (ref 3.4–10.8)

## 2024-06-10 LAB — LIPID PANEL
Chol/HDL Ratio: 2.1 ratio (ref 0.0–4.4)
Cholesterol, Total: 204 mg/dL — ABNORMAL HIGH (ref 100–199)
HDL: 96 mg/dL (ref >39)
LDL Chol Calc (NIH): 99 mg/dL (ref 0–99)
Triglycerides: 49 mg/dL (ref 0–149)
VLDL Cholesterol Cal: 9 mg/dL (ref 5–40)

## 2024-06-10 LAB — COMPREHENSIVE METABOLIC PANEL WITH GFR
ALT: 12 IU/L (ref 0–32)
AST: 20 IU/L (ref 0–40)
Albumin: 4.3 g/dL (ref 3.9–4.9)
Alkaline Phosphatase: 85 IU/L (ref 49–135)
BUN/Creatinine Ratio: 17 (ref 12–28)
BUN: 14 mg/dL (ref 8–27)
Bilirubin Total: 0.5 mg/dL (ref 0.0–1.2)
CO2: 20 mmol/L (ref 20–29)
Calcium: 9.3 mg/dL (ref 8.7–10.3)
Chloride: 107 mmol/L — ABNORMAL HIGH (ref 96–106)
Creatinine, Ser: 0.84 mg/dL (ref 0.57–1.00)
Globulin, Total: 2 g/dL (ref 1.5–4.5)
Glucose: 115 mg/dL — ABNORMAL HIGH (ref 70–99)
Potassium: 4 mmol/L (ref 3.5–5.2)
Sodium: 142 mmol/L (ref 134–144)
Total Protein: 6.3 g/dL (ref 6.0–8.5)
eGFR: 76 mL/min/1.73 (ref >59)

## 2024-06-10 LAB — T4, FREE: Free T4: 0.94 ng/dL (ref 0.82–1.77)

## 2024-06-12 ENCOUNTER — Ambulatory Visit: Payer: Self-pay | Admitting: Family Medicine

## 2024-08-29 ENCOUNTER — Ambulatory Visit

## 2024-08-29 VITALS — BP 128/70 | Ht 60.0 in | Wt 163.6 lb

## 2024-08-29 DIAGNOSIS — Z780 Asymptomatic menopausal state: Secondary | ICD-10-CM

## 2024-08-29 DIAGNOSIS — E119 Type 2 diabetes mellitus without complications: Secondary | ICD-10-CM | POA: Diagnosis not present

## 2024-08-29 DIAGNOSIS — Z0000 Encounter for general adult medical examination without abnormal findings: Secondary | ICD-10-CM | POA: Diagnosis not present

## 2024-08-29 DIAGNOSIS — Z1231 Encounter for screening mammogram for malignant neoplasm of breast: Secondary | ICD-10-CM | POA: Diagnosis not present

## 2024-08-29 NOTE — Patient Instructions (Addendum)
 Lori Nguyen,  Thank you for taking the time for your Medicare Wellness Visit. I appreciate your continued commitment to your health goals. Please review the care plan we discussed, and feel free to reach out if I can assist you further.  Please note that Annual Wellness Visits do not include a physical exam. Some assessments may be limited, especially if the visit was conducted virtually. If needed, we may recommend an in-person follow-up with your provider.  Ongoing Care Seeing your primary care provider every 3 to 6 months helps us  monitor your health and provide consistent, personalized care. 09/09/24 @ 8:10 APPT W/ [email or web address]  Referrals If a referral was made during today's visit and you haven't received any updates within two weeks, please contact the referred provider directly to check on the status.  REFERRALS SENT FOR MAMMOGRAM & BONE DENSITY SCAN You have an order for:  []   2D Mammogram  [x]   3D Mammogram  [x]   Bone Density     Please call for appointment:  [facility]  [address] [place] [address] [phone] [facility] [address] [place], [place] [address] [phone] [facility] [address] [place], [place] [address] [phone]   Make sure to wear two-piece clothing.  No lotions, powders, or deodorants the day of the appointment. Make sure to bring picture ID and insurance card.  Bring list of medications you are currently taking including any supplements.   Schedule your [place] screening mammogram through MyChart!   Log into your MyChart account.  Go to 'Visit' (or 'Appointments' if on mobile App) --> Schedule an Appointment  Under 'Select a Reason for Visit' choose the Mammogram Screening option.  Complete the pre-visit questions and select the time and place that best fits your schedule.   Recommended  Screenings:  Health Maintenance  Topic Date Due   Medicare Annual Wellness Visit  Never done   Complete foot exam   Never done   Eye exam for diabetics  Never done   Hepatitis C Screening  Never done   Osteoporosis screening with Bone Density Scan  Never done   Breast Cancer Screening  08/06/2023   COVID-19 Vaccine (4 - 2025-26 season) 03/31/2024   DTaP/Tdap/Td vaccine (1 - Tdap) 06/09/2025*   Pneumococcal Vaccine for age over 25 (1 of 2 - PCV) 06/09/2025*   Kidney health urinalysis for diabetes  12/05/2024   Hemoglobin A1C  12/07/2024   Yearly kidney function blood test for diabetes  06/09/2025   Colon Cancer Screening  06/15/2025   Flu Shot  Completed   Zoster (Shingles) Vaccine  Completed   Meningitis B Vaccine  Aged Out  *Topic was postponed. The date shown is not the original due date.     Vision: Annual vision screenings are recommended for early detection of glaucoma, cataracts, and diabetic retinopathy. These exams can also reveal signs of chronic conditions such as diabetes and high blood pressure.  Dental: Annual dental screenings help detect early signs of oral cancer, gum disease, and other conditions linked to overall health, including heart disease and diabetes.  Please see the attached documents for additional preventive care recommendations.   NEXT AWV 09/25/25 @ 1:40 PM IN PERSON

## 2024-09-09 ENCOUNTER — Ambulatory Visit: Admitting: Family Medicine

## 2024-10-01 ENCOUNTER — Encounter

## 2024-10-01 ENCOUNTER — Other Ambulatory Visit

## 2025-09-25 ENCOUNTER — Ambulatory Visit
